# Patient Record
Sex: Female | Born: 1980
Health system: Southern US, Community
[De-identification: ages and names within clinical notes are randomized; demographics above are authoritative.]

## PROBLEM LIST (undated history)

## (undated) DIAGNOSIS — E119 Type 2 diabetes mellitus without complications: Secondary | ICD-10-CM

## (undated) DIAGNOSIS — I201 Angina pectoris with documented spasm: Secondary | ICD-10-CM

## (undated) HISTORY — PX: HYSTEROTOMY: SHX1776

## (undated) HISTORY — PX: OOPHORECTOMY: SHX86

## (undated) HISTORY — DX: Angina pectoris with documented spasm: I20.1

## (undated) HISTORY — DX: Type 2 diabetes mellitus without complications: E11.9

---

## 2013-03-12 DIAGNOSIS — I219 Acute myocardial infarction, unspecified: Secondary | ICD-10-CM

## 2013-03-12 HISTORY — DX: Acute myocardial infarction, unspecified: I21.9

## 2018-07-21 ENCOUNTER — Other Ambulatory Visit: Payer: Self-pay

## 2018-07-21 ENCOUNTER — Emergency Department (HOSPITAL_COMMUNITY)
Admission: EM | Admit: 2018-07-21 | Discharge: 2018-07-21 | Disposition: A | Discharge status: Home / Self Care | Payer: Commercial Managed Care - PPO | Attending: Emergency Medicine | Admitting: Emergency Medicine

## 2018-07-21 ENCOUNTER — Emergency Department (HOSPITAL_COMMUNITY): Payer: Commercial Managed Care - PPO

## 2018-07-21 ENCOUNTER — Encounter (HOSPITAL_COMMUNITY): Payer: Self-pay

## 2018-07-21 DIAGNOSIS — E119 Type 2 diabetes mellitus without complications: Secondary | ICD-10-CM | POA: Diagnosis not present

## 2018-07-21 DIAGNOSIS — R079 Chest pain, unspecified: Secondary | ICD-10-CM

## 2018-07-21 DIAGNOSIS — R0789 Other chest pain: Secondary | ICD-10-CM | POA: Diagnosis not present

## 2018-07-21 DIAGNOSIS — Z79899 Other long term (current) drug therapy: Secondary | ICD-10-CM | POA: Diagnosis not present

## 2018-07-21 LAB — CBC WITH DIFFERENTIAL/PLATELET
Abs Immature Granulocytes: 0.03 10*3/uL (ref 0.00–0.07)
Basophils Absolute: 0.1 10*3/uL (ref 0.0–0.1)
Basophils Relative: 1 %
Eosinophils Absolute: 0.2 10*3/uL (ref 0.0–0.5)
Eosinophils Relative: 3 %
HCT: 41.3 % (ref 36.0–46.0)
Hemoglobin: 13.9 g/dL (ref 12.0–15.0)
Immature Granulocytes: 0 %
Lymphocytes Relative: 38 %
Lymphs Abs: 2.9 10*3/uL (ref 0.7–4.0)
MCH: 28.8 pg (ref 26.0–34.0)
MCHC: 33.7 g/dL (ref 30.0–36.0)
MCV: 85.7 fL (ref 80.0–100.0)
Monocytes Absolute: 0.6 10*3/uL (ref 0.1–1.0)
Monocytes Relative: 8 %
Neutro Abs: 3.9 10*3/uL (ref 1.7–7.7)
Neutrophils Relative %: 50 %
Platelets: 277 10*3/uL (ref 150–400)
RBC: 4.82 MIL/uL (ref 3.87–5.11)
RDW: 12.1 % (ref 11.5–15.5)
WBC: 7.7 10*3/uL (ref 4.0–10.5)
nRBC: 0 % (ref 0.0–0.2)

## 2018-07-21 LAB — COMPREHENSIVE METABOLIC PANEL
ALT: 30 U/L (ref 0–44)
AST: 18 U/L (ref 15–41)
Albumin: 3.9 g/dL (ref 3.5–5.0)
Alkaline Phosphatase: 112 U/L (ref 38–126)
Anion gap: 14 (ref 5–15)
BUN: 9 mg/dL (ref 6–20)
CO2: 25 mmol/L (ref 22–32)
Calcium: 9.2 mg/dL (ref 8.9–10.3)
Chloride: 93 mmol/L — ABNORMAL LOW (ref 98–111)
Creatinine, Ser: 0.64 mg/dL (ref 0.44–1.00)
GFR calc Af Amer: >60 mL/min (ref >60)
GFR calc non Af Amer: >60 mL/min (ref >60)
Glucose, Bld: 388 mg/dL — ABNORMAL HIGH (ref 70–99)
Potassium: 3.9 mmol/L (ref 3.5–5.1)
Sodium: 132 mmol/L — ABNORMAL LOW (ref 135–145)
Total Bilirubin: 0.5 mg/dL (ref 0.3–1.2)
Total Protein: 7.5 g/dL (ref 6.5–8.1)

## 2018-07-21 LAB — TROPONIN I
Troponin I: <0.03 ng/mL (ref <0.03)
Troponin I: <0.03 ng/mL (ref <0.03)

## 2018-07-21 LAB — CBG MONITORING, ED: Glucose-Capillary: 324 mg/dL — ABNORMAL HIGH (ref 70–99)

## 2018-07-21 NOTE — Discharge Instructions (Addendum)
Please follow up with your doctor for recheck this week. Return to the emergency department if you have any new or worsening symptoms.   Results for orders placed or performed during the hospital encounter of 07/21/18  Troponin I - Once  Result Value Ref Range   Troponin I <0.03 <0.03 ng/mL  Comprehensive metabolic panel  Result Value Ref Range   Sodium 132 (L) 135 - 145 mmol/L   Potassium 3.9 3.5 - 5.1 mmol/L   Chloride 93 (L) 98 - 111 mmol/L   CO2 25 22 - 32 mmol/L   Glucose, Bld 388 (H) 70 - 99 mg/dL   BUN 9 6 - 20 mg/dL   Creatinine, Ser 8.78 0.44 - 1.00 mg/dL   Calcium 9.2 8.9 - 67.6 mg/dL   Total Protein 7.5 6.5 - 8.1 g/dL   Albumin 3.9 3.5 - 5.0 g/dL   AST 18 15 - 41 U/L   ALT 30 0 - 44 U/L   Alkaline Phosphatase 112 38 - 126 U/L   Total Bilirubin 0.5 0.3 - 1.2 mg/dL   GFR calc non Af Amer >60 >60 mL/min   GFR calc Af Amer >60 >60 mL/min   Anion gap 14 5 - 15  CBC with Differential  Result Value Ref Range   WBC 7.7 4.0 - 10.5 K/uL   RBC 4.82 3.87 - 5.11 MIL/uL   Hemoglobin 13.9 12.0 - 15.0 g/dL   HCT 72.0 94.7 - 09.6 %   MCV 85.7 80.0 - 100.0 fL   MCH 28.8 26.0 - 34.0 pg   MCHC 33.7 30.0 - 36.0 g/dL   RDW 28.3 66.2 - 94.7 %   Platelets 277 150 - 400 K/uL   nRBC 0.0 0.0 - 0.2 %   Neutrophils Relative % 50 %   Neutro Abs 3.9 1.7 - 7.7 K/uL   Lymphocytes Relative 38 %   Lymphs Abs 2.9 0.7 - 4.0 K/uL   Monocytes Relative 8 %   Monocytes Absolute 0.6 0.1 - 1.0 K/uL   Eosinophils Relative 3 %   Eosinophils Absolute 0.2 0.0 - 0.5 K/uL   Basophils Relative 1 %   Basophils Absolute 0.1 0.0 - 0.1 K/uL   Immature Granulocytes 0 %   Abs Immature Granulocytes 0.03 0.00 - 0.07 K/uL  Troponin I - Once  Result Value Ref Range   Troponin I <0.03 <0.03 ng/mL  CBG monitoring, ED  Result Value Ref Range   Glucose-Capillary 324 (H) 70 - 99 mg/dL   Dg Chest Portable 1 View  Result Date: 07/21/2018 CLINICAL DATA:  38 year old female with chest pain. EXAM: PORTABLE CHEST 1  VIEW COMPARISON:  None. FINDINGS: The heart size and mediastinal contours are within normal limits. Both lungs are clear. The visualized skeletal structures are unremarkable. IMPRESSION: No active disease. Electronically Signed   By: Elgie Collard M.D.   On: 07/21/2018 01:33

## 2018-07-21 NOTE — ED Provider Notes (Signed)
[facility] EMERGENCY DEPARTMENT Provider Note   CSN: [id number] Arrival date & time: 07/21/18  0028    History   Chief Complaint Chief Complaint  Patient presents with  . Chest Pain    HPI Debra Mullins is a 38 y.o. female.     Patient with history of DM, Prinzmetal Angina, depression presents with chest pain that started around 10:30 tonight. She denies fever, cough, congestion. No nausea or diaphoresis. She reports feeling a little lightheaded. She works at [employer] and started having to wear a mask at work which she feels is making her work to breathe and that this is the cause of her chest pain tonight. She took 3 aspirin, an antacid prior to coming to the hospital without any relief.   The history is provided by the patient. No language interpreter was used.  Chest Pain  Associated symptoms: shortness of breath   Associated symptoms: no abdominal pain, no cough, no fever and no nausea     History reviewed. No pertinent past medical history.  There are no active problems to display for this patient.    OB History   No obstetric history on file.      Home Medications    Prior to Admission medications   Medication Sig Start Date End Date Taking? Authorizing Provider  diltiazem (CARDIZEM) 30 MG tablet Take 30 mg by mouth 2 (two) times daily.   Yes [provider]  escitalopram (LEXAPRO) 10 MG tablet Take 10 mg by mouth daily.   Yes [provider]    Family History History reviewed. No pertinent family history.  Social History Social History   Tobacco Use  . Smoking status: Not on file  Substance Use Topics  . Alcohol use: Not on file  . Drug use: Not on file     Allergies   Sulfa antibiotics and Zithromax [azithromycin]   Review of Systems Review of Systems  Constitutional: Negative for chills and fever.  HENT: Negative.  Negative for congestion and sore throat.   Respiratory: Positive for shortness  of breath. Negative for cough.   Cardiovascular: Positive for chest pain.  Gastrointestinal: Negative.  Negative for abdominal pain and nausea.  Musculoskeletal: Negative.   Skin: Negative.   Neurological: Positive for light-headedness. Negative for syncope.     Physical Exam Updated Vital Signs BP (!) 146/79   Temp 98.1 F (36.7 C) (Oral)   Resp 16   Ht 5\' 11"  (1.803 m)   Wt 136.1 kg   SpO2 100%   BMI 41.84 kg/m   Physical Exam Vitals signs and nursing note reviewed.  Constitutional:      Appearance: She is well-developed.  HENT:     Head: Normocephalic.  Neck:     Musculoskeletal: Normal range of motion and neck supple.  Cardiovascular:     Rate and Rhythm: Normal rate and regular rhythm.  Pulmonary:     Effort: Pulmonary effort is normal.     Breath sounds: Normal breath sounds. No wheezing, rhonchi or rales.     Comments: No chest wall tenderness.  Abdominal:     General: Bowel sounds are normal.     Palpations: Abdomen is soft.     Tenderness: There is no abdominal tenderness. There is no guarding or rebound.  Musculoskeletal: Normal range of motion.     Right lower leg: No edema.     Left lower leg: No edema.  Skin:    General: Skin is warm  and dry.     Findings: No rash.  Neurological:     General: No focal deficit present.     Mental Status: She is alert and oriented to person, place, and time.      ED Treatments / Results  Labs (all labs ordered are listed, but only abnormal results are displayed) Labs Reviewed  TROPONIN I  COMPREHENSIVE METABOLIC PANEL  CBC WITH DIFFERENTIAL/PLATELET    EKG EKG Interpretation  Date/Time:  Monday Jul 21 2018 00:44:12 EDT Ventricular Rate:  85 PR Interval:    QRS Duration: 89 QT Interval:  374 QTC Calculation: 445 R Axis:   22 Text Interpretation:  Sinus rhythm Low voltage, precordial leads No previous ECGs available Confirmed by Zadie Rhine ([id number]) on 07/21/2018 1:02:43 AM   Radiology No results  found.  Procedures Procedures (including critical care time)  Medications Ordered in ED Medications - No data to display   Initial Impression / Assessment and Plan / ED Course  I have reviewed the triage vital signs and the nursing notes.  Pertinent labs & imaging results that were available during my care of the patient were reviewed by me and considered in my medical decision making (see chart for details).        Patient with a history of Prinzmetal's Angina (5 years ago, "clean arteries, no stents") presents with CP starting tonight. She feels the mask she is now required to wear at work is causing her to have to breathe harder and this may be the source of her pain. Pain is getting better over time. No change with aspirin or antacid at home. No fever, congestion, cough.   Labs, including 2 troponins, are unremarkable with the exception of CBG elevated to over 300. She reports she did not take any insulin today but she will when she gets home. No acidosis, nausea, vomiting.   She can be discharged home with recommendation to follow up with her doctor for recheck this week.   Final Clinical Impressions(s) / ED Diagnoses   Final diagnoses:  None   1. Nonspecific chest pain  ED Discharge Orders    None       Elpidio Anis, PA-C 07/21/18 0536    Zadie Rhine, MD 07/21/18 [phone]

## 2018-07-21 NOTE — ED Provider Notes (Signed)
Patient seen/examined in the Emergency Department in conjunction with Advanced Practice Provider [place] Patient reports chest pain with h/o printzmetal angina.  Reports "clean" cath 5 yrs ago in [place] Exam : awake/alert, no distress, denies CP at this time Plan: workup in process.  Anticipate discharge home     Zadie Rhine, MD 07/21/18 [phone]

## 2019-02-02 DIAGNOSIS — R1032 Left lower quadrant pain: Secondary | ICD-10-CM | POA: Diagnosis not present

## 2019-02-02 DIAGNOSIS — Z6839 Body mass index (BMI) 39.0-39.9, adult: Secondary | ICD-10-CM | POA: Diagnosis not present

## 2019-02-02 DIAGNOSIS — R1031 Right lower quadrant pain: Secondary | ICD-10-CM | POA: Diagnosis not present

## 2019-02-10 DIAGNOSIS — E1165 Type 2 diabetes mellitus with hyperglycemia: Secondary | ICD-10-CM | POA: Diagnosis not present

## 2019-02-10 DIAGNOSIS — Z6841 Body Mass Index (BMI) 40.0 and over, adult: Secondary | ICD-10-CM | POA: Diagnosis not present

## 2019-02-10 DIAGNOSIS — Z299 Encounter for prophylactic measures, unspecified: Secondary | ICD-10-CM | POA: Diagnosis not present

## 2019-02-10 DIAGNOSIS — Z789 Other specified health status: Secondary | ICD-10-CM | POA: Diagnosis not present

## 2019-02-10 DIAGNOSIS — N809 Endometriosis, unspecified: Secondary | ICD-10-CM | POA: Diagnosis not present

## 2019-02-10 DIAGNOSIS — R109 Unspecified abdominal pain: Secondary | ICD-10-CM | POA: Diagnosis not present

## 2019-03-16 DIAGNOSIS — N809 Endometriosis, unspecified: Secondary | ICD-10-CM | POA: Diagnosis not present

## 2019-03-16 DIAGNOSIS — Z6841 Body Mass Index (BMI) 40.0 and over, adult: Secondary | ICD-10-CM | POA: Diagnosis not present

## 2019-03-16 DIAGNOSIS — S39012A Strain of muscle, fascia and tendon of lower back, initial encounter: Secondary | ICD-10-CM | POA: Diagnosis not present

## 2019-04-06 DIAGNOSIS — B9689 Other specified bacterial agents as the cause of diseases classified elsewhere: Secondary | ICD-10-CM | POA: Diagnosis not present

## 2019-04-06 DIAGNOSIS — R05 Cough: Secondary | ICD-10-CM | POA: Diagnosis not present

## 2019-04-06 DIAGNOSIS — J209 Acute bronchitis, unspecified: Secondary | ICD-10-CM | POA: Diagnosis not present

## 2019-04-06 DIAGNOSIS — Z881 Allergy status to other antibiotic agents status: Secondary | ICD-10-CM | POA: Diagnosis not present

## 2019-04-06 DIAGNOSIS — R0602 Shortness of breath: Secondary | ICD-10-CM | POA: Diagnosis not present

## 2019-04-06 DIAGNOSIS — Z882 Allergy status to sulfonamides status: Secondary | ICD-10-CM | POA: Diagnosis not present

## 2019-04-06 DIAGNOSIS — R079 Chest pain, unspecified: Secondary | ICD-10-CM | POA: Diagnosis not present

## 2019-04-06 DIAGNOSIS — Z888 Allergy status to other drugs, medicaments and biological substances status: Secondary | ICD-10-CM | POA: Diagnosis not present

## 2019-06-16 DIAGNOSIS — Z794 Long term (current) use of insulin: Secondary | ICD-10-CM | POA: Diagnosis not present

## 2019-06-16 DIAGNOSIS — Z87892 Personal history of anaphylaxis: Secondary | ICD-10-CM | POA: Diagnosis not present

## 2019-06-16 DIAGNOSIS — M791 Myalgia, unspecified site: Secondary | ICD-10-CM | POA: Diagnosis not present

## 2019-06-16 DIAGNOSIS — Z883 Allergy status to other anti-infective agents status: Secondary | ICD-10-CM | POA: Diagnosis not present

## 2019-06-16 DIAGNOSIS — B349 Viral infection, unspecified: Secondary | ICD-10-CM | POA: Diagnosis not present

## 2019-06-16 DIAGNOSIS — R110 Nausea: Secondary | ICD-10-CM | POA: Diagnosis not present

## 2019-06-16 DIAGNOSIS — Z79899 Other long term (current) drug therapy: Secondary | ICD-10-CM | POA: Diagnosis not present

## 2019-06-16 DIAGNOSIS — Z888 Allergy status to other drugs, medicaments and biological substances status: Secondary | ICD-10-CM | POA: Diagnosis not present

## 2019-06-16 DIAGNOSIS — Z20822 Contact with and (suspected) exposure to covid-19: Secondary | ICD-10-CM | POA: Diagnosis not present

## 2019-06-16 DIAGNOSIS — Z882 Allergy status to sulfonamides status: Secondary | ICD-10-CM | POA: Diagnosis not present

## 2019-06-16 DIAGNOSIS — R0789 Other chest pain: Secondary | ICD-10-CM | POA: Diagnosis not present

## 2019-06-16 DIAGNOSIS — R5383 Other fatigue: Secondary | ICD-10-CM | POA: Diagnosis not present

## 2019-06-16 DIAGNOSIS — E1165 Type 2 diabetes mellitus with hyperglycemia: Secondary | ICD-10-CM | POA: Diagnosis not present

## 2019-06-16 DIAGNOSIS — R079 Chest pain, unspecified: Secondary | ICD-10-CM | POA: Diagnosis not present

## 2019-06-17 ENCOUNTER — Ambulatory Visit (INDEPENDENT_AMBULATORY_CARE_PROVIDER_SITE_OTHER): Payer: BC Managed Care – PPO | Admitting: Cardiovascular Disease

## 2019-06-17 ENCOUNTER — Telehealth: Payer: Self-pay | Admitting: Cardiovascular Disease

## 2019-06-17 ENCOUNTER — Encounter: Payer: Self-pay | Admitting: *Deleted

## 2019-06-17 ENCOUNTER — Other Ambulatory Visit: Payer: Self-pay

## 2019-06-17 VITALS — BP 130/94 | HR 85 | Ht 71.0 in | Wt 296.0 lb

## 2019-06-17 DIAGNOSIS — R030 Elevated blood-pressure reading, without diagnosis of hypertension: Secondary | ICD-10-CM | POA: Diagnosis not present

## 2019-06-17 DIAGNOSIS — R079 Chest pain, unspecified: Secondary | ICD-10-CM

## 2019-06-17 DIAGNOSIS — Z8679 Personal history of other diseases of the circulatory system: Secondary | ICD-10-CM | POA: Diagnosis not present

## 2019-06-17 NOTE — Telephone Encounter (Signed)
  Precert needed for: Echo and Lexiscan  Location:   [facility] Date:  July 02, 2019

## 2019-06-17 NOTE — Progress Notes (Signed)
CARDIOLOGY CONSULT NOTE  Patient ID: Kylia Grajales MRN: [id number] DOB/AGE: [birth year] 39 y.o.  Admit date: (Not on file) Primary Physician: Patient, No Pcp Per  Reason for Consultation: Chest pain  HPI: Debra Mullins is a 39 y.o. female who is being seen today for the evaluation of chest pain.  She is self-referred.  Past medical history includes morbid obesity.  She was evaluated yesterday at [facility] for chest pain.  She complained of 2 days of malaise which got worse with some body aches and some nausea and feelings of coldness.  I reviewed extensive documentation records in the EMR.  Labs included white blood cells 10.6, hemoglobin 14.3, platelets 262, sodium 134, potassium 4.2, BUN 12, creatinine 0.53, negative for COVID-19, elevated total CK of 420, elevated CK-MB of 10.9.  Troponins were normal.  Chest x-ray showed no active disease.  It was felt she had a viral syndrome.  An ECG was performed but I do not have the results.  It reportedly demonstrated sinus rhythm.  She continues to complain of diffuse body aches and generalized fatigue.  She has episodic right-sided chest pains at rest.  She reportedly had an MI 6 years ago and was diagnosed with Prinzmetal's angina.  She underwent cardiac catheterization and there were no blockages.  This was performed at [facility] [place], [place]).  She followed with a cardiologist there, Dr. Jacqulyn Liner, who works in [place].  She has a lot of work-related stress.  She has received the first dose of the COVID-19 vaccine.  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  She drinks 5-6 bottles of water daily.  She is here with her husband.  Social history: They have a daughter who is graduating college in [place] within the next few months and will be pursuing her masters in Chief Technology Officer at [employer].        Allergies  Allergen Reactions  . Sulfa Antibiotics Anaphylaxis  . Zithromax  [Azithromycin] Diarrhea  . Prednisone Palpitations    Current Outpatient Medications  Medication Sig Dispense Refill  . acetaminophen (TYLENOL) 500 MG tablet Take 500 mg by mouth every 6 (six) hours as needed for mild pain or headache.     No current facility-administered medications for this visit.    Past Medical History:  Diagnosis Date  . Diabetes ([place])   . Heart attack ([place]) 2015    Past Surgical History:  Procedure Laterality Date  . HYSTEROTOMY    . OOPHORECTOMY      Social History   Socioeconomic History  . Marital status: Married    Spouse name: Not on file  . Number of children: Not on file  . Years of education: Not on file  . Highest education level: Not on file  Occupational History  . Not on file  Tobacco Use  . Smoking status: Never Smoker  . Smokeless tobacco: Never Used  Substance and Sexual Activity  . Alcohol use: Not on file  . Drug use: Not on file  . Sexual activity: Not on file  Other Topics Concern  . Not on file  Social History Narrative  . Not on file   Social Determinants of Health   Financial Resource Strain:   . Difficulty of Paying Living Expenses:   Food Insecurity:   . Worried About Charity fundraiser in the Last Year:   . Arboriculturist in the Last Year:   Transportation Needs:   .  Lack of Transportation (Medical):   Marland Kitchen Lack of Transportation (Non-Medical):   Physical Activity:   . Days of Exercise per Week:   . Minutes of Exercise per Session:   Stress:   . Feeling of Stress :   Social Connections:   . Frequency of Communication with Friends and Family:   . Frequency of Social Gatherings with Friends and Family:   . Attends Religious Services:   . Active Member of Clubs or Organizations:   . Attends Banker Meetings:   Marland Kitchen Marital Status:   Intimate Partner Violence:   . Fear of Current or Ex-Partner:   . Emotionally Abused:   Marland Kitchen Physically Abused:   . Sexually Abused:      No family history of  premature CAD in 1st degree relatives.  Current Meds  Medication Sig  . acetaminophen (TYLENOL) 500 MG tablet Take 500 mg by mouth every 6 (six) hours as needed for mild pain or headache.  . [DISCONTINUED] diltiazem (CARDIZEM) 30 MG tablet Take 30 mg by mouth 2 (two) times daily.      Review of systems complete and found to be negative unless listed above in HPI   Marlyn Corporal, RN was present throughout the entirety of the encounter.  Physical exam Blood pressure (!) 130/94, pulse 85, height 5\' 11"  (1.803 m), weight 296 lb (134.3 kg), SpO2 98 %. General: Obese female in NAD Neck: No JVD, no thyromegaly or thyroid nodule.  Lungs: Clear to auscultation bilaterally with normal respiratory effort. CV: Nondisplaced PMI. Regular rate and rhythm, normal S1/S2, no S3/S4, no murmur.  No peripheral edema.  No carotid bruit.    Abdomen: Soft, nontender, obese.  Skin: Intact without lesions or rashes.  Neurologic: Alert and oriented x 3.  Psych: Normal affect. Extremities: No clubbing or cyanosis.  HEENT: Normal.   ECG: Most recent ECG reviewed.   Labs: Lab Results  Component Value Date/Time   K 3.9 07/21/2018 12:54 AM   BUN 9 07/21/2018 12:54 AM   CREATININE 0.64 07/21/2018 12:54 AM   ALT 30 07/21/2018 12:54 AM   HGB 13.9 07/21/2018 12:54 AM     Lipids: No results found for: LDLCALC, LDLDIRECT, CHOL, TRIG, HDL      ASSESSMENT AND PLAN:   1.  Chest pain: She reportedly has a history of Prinzmetal's angina.  I will request records from 09/20/2018 including cardiac catheterization report.  I will proceed with a nuclear myocardial perfusion imaging study to evaluate for ischemic heart disease (Lexiscan Myoview). I will order a 2-D echocardiogram with Doppler to evaluate cardiac structure, function, and regional wall motion.  2.  Elevated blood pressure: She does not carry diagnosis of hypertension.  Diastolic blood pressure is mildly elevated in our office and was elevated in the  ED.  This will need further monitoring.    Disposition: Follow up in 2 months virtual visit  Signed: [place], M.D., F.A.C.C.  06/17/2019, 4:06 PM

## 2019-06-17 NOTE — Patient Instructions (Signed)
Medication Instructions:  Your physician recommends that you continue on your current medications as directed. Please refer to the Current Medication list given to you today.  *If you need a refill on your cardiac medications before your next appointment, please call your pharmacy*   Lab Work: None today If you have labs (blood work) drawn today and your tests are completely normal, you will receive your results only by: Marland Kitchen MyChart Message (if you have MyChart) OR . A paper copy in the mail If you have any lab test that is abnormal or we need to change your treatment, we will call you to review the results.   Testing/Procedures: Your physician has requested that you have an echocardiogram. Echocardiography is a painless test that uses sound waves to create images of your heart. It provides your doctor with information about the size and shape of your heart and how well your heart's chambers and valves are working. This procedure takes approximately one hour. There are no restrictions for this procedure.  Your physician has requested that you have a lexiscan myoview. For further information please visit [email or web address] Please follow instruction sheet, as given.     Follow-Up: At [facility], you and your health needs are our priority.  As part of our continuing mission to provide you with exceptional heart care, we have created designated Provider Care Teams.  These Care Teams include your primary Cardiologist (physician) and Advanced Practice Providers (APPs -  Physician Assistants and Nurse Practitioners) who all work together to provide you with the care you need, when you need it.  We recommend signing up for the patient portal called "MyChart".  Sign up information is provided on this After Visit Summary.  MyChart is used to connect with patients for Virtual Visits (Telemedicine).  Patients are able to view lab/test results, encounter notes, upcoming appointments, etc.  Non-urgent  messages can be sent to your provider as well.   To learn more about what you can do with MyChart, go to [email or web address].    Your next appointment:   2 month(s)  The format for your next appointment:   Virtual Visit   Provider:   Prentice Docker, MD   Other Instructions None     Thank you for choosing [facility] !

## 2019-06-25 DIAGNOSIS — T50Z95A Adverse effect of other vaccines and biological substances, initial encounter: Secondary | ICD-10-CM | POA: Diagnosis not present

## 2019-06-25 DIAGNOSIS — R000 Tachycardia, unspecified: Secondary | ICD-10-CM | POA: Diagnosis not present

## 2019-06-25 DIAGNOSIS — Z881 Allergy status to other antibiotic agents status: Secondary | ICD-10-CM | POA: Diagnosis not present

## 2019-06-25 DIAGNOSIS — Z888 Allergy status to other drugs, medicaments and biological substances status: Secondary | ICD-10-CM | POA: Diagnosis not present

## 2019-06-25 DIAGNOSIS — R509 Fever, unspecified: Secondary | ICD-10-CM | POA: Diagnosis not present

## 2019-06-25 DIAGNOSIS — Z90710 Acquired absence of both cervix and uterus: Secondary | ICD-10-CM | POA: Diagnosis not present

## 2019-06-25 DIAGNOSIS — Z794 Long term (current) use of insulin: Secondary | ICD-10-CM | POA: Diagnosis not present

## 2019-06-25 DIAGNOSIS — Z882 Allergy status to sulfonamides status: Secondary | ICD-10-CM | POA: Diagnosis not present

## 2019-06-25 DIAGNOSIS — R079 Chest pain, unspecified: Secondary | ICD-10-CM | POA: Diagnosis not present

## 2019-06-25 DIAGNOSIS — T50B95A Adverse effect of other viral vaccines, initial encounter: Secondary | ICD-10-CM | POA: Diagnosis not present

## 2019-07-02 ENCOUNTER — Other Ambulatory Visit: Payer: Self-pay

## 2019-07-02 ENCOUNTER — Ambulatory Visit (HOSPITAL_BASED_OUTPATIENT_CLINIC_OR_DEPARTMENT_OTHER)
Admission: RE | Admit: 2019-07-02 | Discharge: 2019-07-02 | Disposition: A | Discharge status: Home / Self Care | Payer: BC Managed Care – PPO | Source: Ambulatory Visit | Attending: Cardiovascular Disease | Admitting: Cardiovascular Disease

## 2019-07-02 ENCOUNTER — Encounter (HOSPITAL_COMMUNITY)
Admission: RE | Admit: 2019-07-02 | Discharge: 2019-07-02 | Disposition: A | Discharge status: Home / Self Care | Payer: BC Managed Care – PPO | Source: Ambulatory Visit | Attending: Cardiovascular Disease | Admitting: Cardiovascular Disease

## 2019-07-02 ENCOUNTER — Encounter (HOSPITAL_COMMUNITY): Payer: Self-pay

## 2019-07-02 ENCOUNTER — Ambulatory Visit (HOSPITAL_COMMUNITY)
Admission: RE | Admit: 2019-07-02 | Discharge: 2019-07-02 | Disposition: A | Discharge status: Home / Self Care | Payer: BC Managed Care – PPO | Source: Ambulatory Visit | Attending: Cardiovascular Disease | Admitting: Cardiovascular Disease

## 2019-07-02 DIAGNOSIS — R079 Chest pain, unspecified: Secondary | ICD-10-CM

## 2019-07-02 LAB — NM MYOCAR MULTI W/SPECT W/WALL MOTION / EF
LV dias vol: 92 mL (ref 46–106)
LV sys vol: 42 mL
Peak HR: 118 {beats}/min
RATE: 0.55
Rest HR: 75 {beats}/min
SDS: 1
SRS: 0
SSS: 1
TID: 1.06

## 2019-07-02 MED ORDER — SODIUM CHLORIDE FLUSH 0.9 % IV SOLN
INTRAVENOUS | Status: AC
  Administered 2019-07-02: 11:00:00 10 mL via INTRAVENOUS
  Filled 2019-07-02: qty 10

## 2019-07-02 MED ORDER — REGADENOSON 0.4 MG/5ML IV SOLN
INTRAVENOUS | Status: AC
  Administered 2019-07-02: 0.4 mg via INTRAVENOUS
  Filled 2019-07-02: qty 5

## 2019-07-02 MED ORDER — TECHNETIUM TC 99M TETROFOSMIN IV KIT
30.0000 | PACK | Freq: Once | INTRAVENOUS | Status: AC | PRN
  Administered 2019-07-02: 31.1 via INTRAVENOUS

## 2019-07-02 MED ORDER — TECHNETIUM TC 99M TETROFOSMIN IV KIT
10.0000 | PACK | Freq: Once | INTRAVENOUS | Status: AC | PRN
  Administered 2019-07-02: 9.88 via INTRAVENOUS

## 2019-07-02 NOTE — Progress Notes (Signed)
*  PRELIMINARY RESULTS* Echocardiogram 2D Echocardiogram has been performed.  Stacey Drain 07/02/2019, 9:24 AM

## 2019-07-08 ENCOUNTER — Telehealth: Payer: Self-pay | Admitting: *Deleted

## 2019-07-08 NOTE — Telephone Encounter (Signed)
STRESS TEST -  Laqueta Linden, MD  07/02/2019 3:00 PM EDT    No significant blockages    ECHO -    Laqueta Linden, MD  07/02/2019 10:44 AM EDT    Normal pumping function

## 2019-07-08 NOTE — Telephone Encounter (Signed)
Patient notified.  No pcp.

## 2019-09-08 ENCOUNTER — Telehealth: Payer: BC Managed Care – PPO | Admitting: Cardiovascular Disease

## 2020-02-19 IMAGING — DX PORTABLE CHEST - 1 VIEW
1 series · 1 of 1 positions shown · non-contrast
Comparison: None.

CLINICAL DATA: 38-year-old female with chest pain.

EXAM:
PORTABLE CHEST 1 VIEW

[chest]
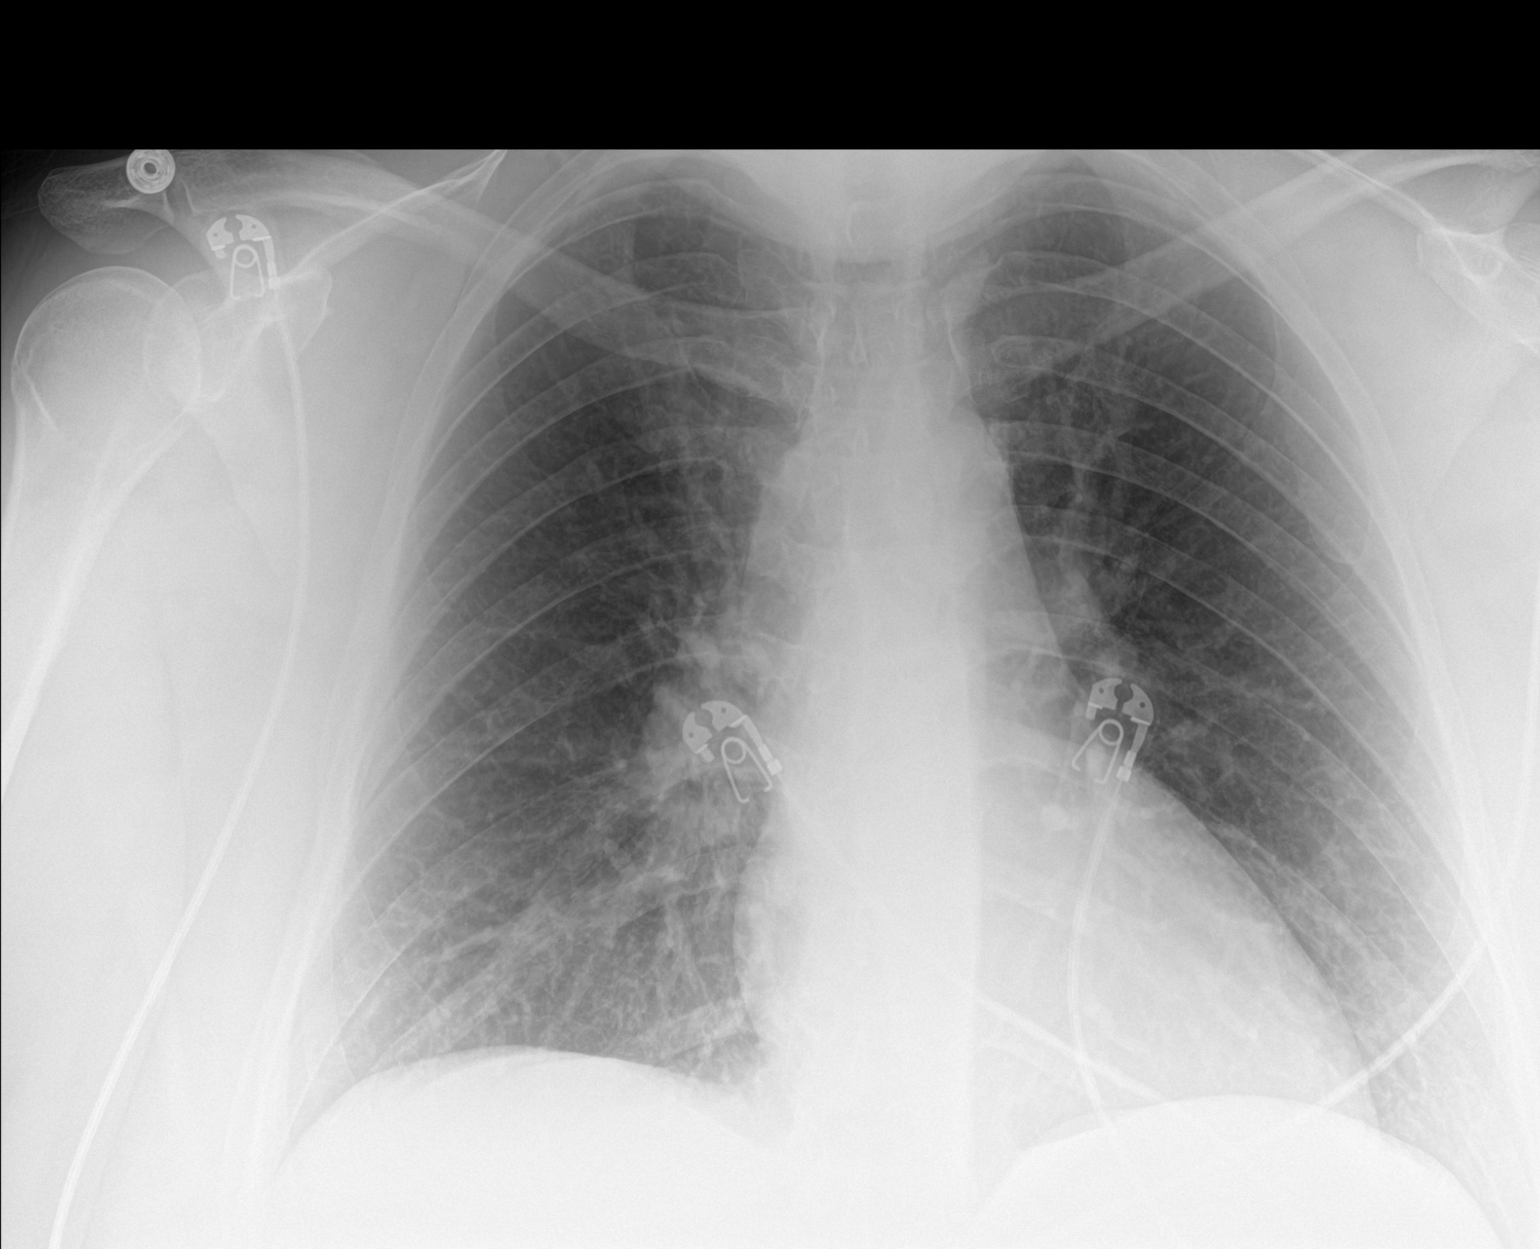

[1 of 1 positions shown; findings below may reference images not displayed]

FINDINGS: The heart size and mediastinal contours are within normal limits.
Both lungs are clear. The visualized skeletal structures are
unremarkable.
IMPRESSION: No active disease.

## 2020-03-03 DIAGNOSIS — Z20822 Contact with and (suspected) exposure to covid-19: Secondary | ICD-10-CM | POA: Diagnosis not present

## 2020-03-03 DIAGNOSIS — J069 Acute upper respiratory infection, unspecified: Secondary | ICD-10-CM | POA: Diagnosis not present

## 2020-04-08 ENCOUNTER — Telehealth: Payer: Self-pay | Admitting: Family Medicine

## 2020-04-08 DIAGNOSIS — R079 Chest pain, unspecified: Secondary | ICD-10-CM | POA: Diagnosis not present

## 2020-04-08 NOTE — Telephone Encounter (Signed)
Spoke with husband that walked into office - stated that wife works at [employer] (not clinical) & wanted him to come by office for her.  Stated that she has had this x 2 day with worsening symptoms - advised ED if having active symptoms.  Informed husband that I will call patient to get further information.  He verbalized understanding.

## 2020-04-08 NOTE — Telephone Encounter (Signed)
New message     Pt c/o of Chest Pain: STAT if CP now or developed within 24 hours  1. Are you having CP right now?  Chest pressure   2. Are you experiencing any other symptoms (ex. SOB, nausea, vomiting, sweating)? Fluttering, sob, pain in left shoulder and behind the breast bone , fatigue   3. How long have you been experiencing CP? Off and on for awhile , got worse yesterday   4. Is your CP continuous or coming and going?comes and goes   5. Have you taken Nitroglycerin?  No it expired  ?

## 2020-04-08 NOTE — Telephone Encounter (Signed)
Complain of tightness in chest, fluttering.  Does not feel like heart is beating any faster.    Been off/on x 3-4 months, but worse the last few days.  Does not feels like she needs to be seen in ED.  States that she does not feel like she did when she had her heart attack.  States that she does also have Prinzmetal's angina & tries to manage this at home.  Patient not on any cardiac medications.    OV scheduled for Tuesday, 2/1/20222 with Tereso Newcomer, PA at 8:45 am in [place] office.    Advised to be evaluated in ED if symptoms worsen in the meantime.  She verbalized understanding.

## 2020-04-08 NOTE — Telephone Encounter (Signed)
Left message to return call on patient's voice mail.

## 2020-04-11 NOTE — Progress Notes (Unsigned)
Cardiology Office Note:    Date:  04/12/2020   ID:  Debra Mullins, DOB November 23, 1980, MRN [id number]  PCP:  Patient, No Pcp Per  [place] Cardiologist:   Will need to establish with MD [place] Electrophysiologist:  None   Referring MD: No ref. provider found   Chief Complaint:  Chest Pain and Palpitations    Patient Profile:    Debra Mullins is a 40 y.o. female with:   Prinzmetal's Angina  Hx of NSTEMI in 2015 (according to notes; cath at [facility] in [place], [place] w/o blockages)  Diabetes mellitus   Hyperlipidemia   GERD   Obesity   Prior CV studies: Echocardiogram 07/02/19 EF 55-60, no RWMA, normal RVSF, trivial MR  Myoview 07/02/19 No ischemia or scar, EF 54; low risk   History of Present Illness:    Debra Mullins was evaluated by Dr. Bronson Ing in 4/21 for chest pain.  She has a hx of Prinzmetal's angina and notes indicate she had a cath around 2015 in the setting of a NSTEMI in [place] that showed no CAD.  Records were to be obtained, but no cath records can be located in her chart.  A f/u Myoview was low risk and an echocardiogram demonstrated normal EF.  She called in recently with chest pain and palpitations and is added on for evaluation.  Over the past several days, the patient notes fluttering in her chest as well as chest tightness.  This seems to be worse today and has been fairly constant for the past 24 hours.  She notes some shortness of breath associated with it.  She has not had syncope.  Her symptoms do remind her of what she had after her severe chest pain that she had with her heart attack 6 years ago.      Past Medical History:  Diagnosis Date  . Diabetes ([place])   . Heart attack ([place]) 2015    Current Medications: Current Meds  Medication Sig  . acetaminophen (TYLENOL) 500 MG tablet Take 500 mg by mouth every 6 (six) hours as needed for mild pain or headache.  Marland Kitchen aspirin EC 81 MG tablet Take 1 tablet (81 mg total) by mouth daily. Swallow whole.  .  diltiazem (CARDIZEM CD) 120 MG 24 hr capsule Take 1 capsule (120 mg total) by mouth daily.  . Insulin Degludec (TRESIBA [place]) Inject into the skin.  Marland Kitchen nitroGLYCERIN (NITROSTAT) 0.4 MG SL tablet Place 1 tablet (0.4 mg total) under the tongue every 5 (five) minutes as needed for chest pain.  . rosuvastatin (CRESTOR) 10 MG tablet Take 1 tablet (10 mg total) by mouth daily.     Allergies:   Sulfa antibiotics, Zithromax [azithromycin], and Prednisone   Social History   Tobacco Use  . Smoking status: Never Smoker  . Smokeless tobacco: Never Used     Family Hx: The patient's family history includes CAD in her mother; Heart attack in her paternal aunt.  Review of Systems  Constitutional: Negative for chills and fever.  Respiratory: Negative for cough.   Gastrointestinal: Negative for hematochezia and melena.  Genitourinary: Negative for hematuria.  All other systems reviewed and are negative.    EKGs/Labs/Other Test Reviewed:    EKG:  EKG is  ordered today.  The ekg ordered today demonstrates normal sinus rhythm, heart rate 89, normal axis, low voltage, no acute ST-T wave changes, QTC 445  Recent Labs: 04/12/2020: ALT 27; BUN 12; Creatinine, Ser 0.63; Hemoglobin 14.6; Platelets 290; Potassium 4.4;  Sodium 131   Recent Lipid Panel No results found for: CHOL, TRIG, HDL, CHOLHDL, LDLCALC, LDLDIRECT    Risk Assessment/Calculations:      Physical Exam:    VS:  BP (!) 150/94   Pulse 87   Ht 5' 11"  (1.803 m)   Wt 291 lb 6.4 oz (132.2 kg)   SpO2 96%   BMI 40.64 kg/m     Wt Readings from Last 3 Encounters:  04/12/20 291 lb 6.4 oz (132.2 kg)  06/17/19 296 lb (134.3 kg)  07/21/18 300 lb (136.1 kg)     Constitutional:      Appearance: Healthy appearance. Not in distress.  Neck:     Vascular: No JVR. JVD normal.  Pulmonary:     Effort: Pulmonary effort is normal.     Breath sounds: No wheezing. No rales.  Cardiovascular:     Normal rate. Regular rhythm. Normal S1. Normal S2.      Murmurs: There is no murmur.  Pulses:    Intact distal pulses.  Edema:    Peripheral edema absent.  Abdominal:     Palpations: Abdomen is soft. There is no hepatomegaly.  Skin:    General: Skin is warm and dry.  Neurological:     Mental Status: Alert and oriented to person, place and time.     Cranial Nerves: Cranial nerves are intact.      ASSESSMENT & PLAN:    1. Angina pectoris ([place]) She has a history of Prinzmetal's angina.  She has a history of prior non-STEMI in [place] 6 years ago.  Her cardiac catheterization demonstrated normal coronary arteries, per her report.  I do not have any records.  Those will be requested.  Over the past several days, she has had symptoms of palpitations and chest tightness.  This has been with minimal activity.  She is also had at rest.  She has had rest symptoms for the past 24 hours.  Her electrocardiogram does not demonstrate any acute changes.  She had an EKG at the urgent care the other day that appears to have some limb lead reversal but otherwise, there were no acute changes.  She likely is having exacerbation of her Prinzmetal's angina.  She does have diabetes which puts her at risk for obstructive coronary disease.  I reviewed her case with Dr. Harl Bowie (attending MD).  As her EKG is normal, we feel that she can be managed as an outpatient.  I will obtain a c-Met, CBC and stat high-sensitivity troponin today.  If her troponin is abnormal, she will be sent to the emergency room for inpatient evaluation.  I will start her on diltiazem CD 120 mg daily, aspirin 81 mg daily, as needed nitroglycerin and rosuvastatin 10 mg daily.  I will also obtain a limited echo to reassess EF and rule out wall motion abnormalities.  She will be seen in the next several days for follow-up.  She knows to go the emergency room if her symptoms should worsen.  If she continues to have symptoms at follow-up despite antianginal therapy, we will need to consider coronary CTA versus  relook cardiac catheterization.  2. Palpitations As noted, I will place her on diltiazem.  If she continues to have symptoms of palpitations, may need to consider event monitor.  3. Type 2 diabetes mellitus without complication, with long-term current use of insulin ([place]) She is on insulin therapy.  She notes poor control of diabetes.  She needs to establish with primary care.  4.  Pure hypercholesterolemia She notes myalgias with simvastatin in the past.  We will try rosuvastatin as noted above.  At follow-up, arrange fasting lipids and LFTs in 3 months        Dispo:  Return in about 1 week (around 04/19/2020).   Medication Adjustments/Labs and Tests Ordered: Current medicines are reviewed at length with the patient today.  Concerns regarding medicines are outlined above.  Tests Ordered: Orders Placed This Encounter  Procedures  . Comprehensive metabolic panel  . CBC  . Troponin T, STAT ([facility])  . EKG 12-Lead  . ECHOCARDIOGRAM LIMITED   Medication Changes: Meds ordered this encounter  Medications  . diltiazem (CARDIZEM CD) 120 MG 24 hr capsule    Sig: Take 1 capsule (120 mg total) by mouth daily.    Dispense:  90 capsule    Refill:  3  . rosuvastatin (CRESTOR) 10 MG tablet    Sig: Take 1 tablet (10 mg total) by mouth daily.    Dispense:  90 tablet    Refill:  3  . nitroGLYCERIN (NITROSTAT) 0.4 MG SL tablet    Sig: Place 1 tablet (0.4 mg total) under the tongue every 5 (five) minutes as needed for chest pain.    Dispense:  25 tablet    Refill:  11  . aspirin EC 81 MG tablet    Sig: Take 1 tablet (81 mg total) by mouth daily. Swallow whole.    Dispense:  30 tablet    Refill:  [address] [place], [place]  04/12/2020 [facility] [place], [place], [place]  [address] Phone: [phone]; Fax: [phone]

## 2020-04-12 ENCOUNTER — Other Ambulatory Visit: Payer: Self-pay

## 2020-04-12 ENCOUNTER — Other Ambulatory Visit (HOSPITAL_COMMUNITY)
Admission: RE | Admit: 2020-04-12 | Discharge: 2020-04-12 | Disposition: A | Discharge status: Home / Self Care | Payer: BC Managed Care – PPO | Source: Ambulatory Visit | Attending: Physician Assistant | Admitting: Physician Assistant

## 2020-04-12 ENCOUNTER — Ambulatory Visit (INDEPENDENT_AMBULATORY_CARE_PROVIDER_SITE_OTHER): Payer: BC Managed Care – PPO | Admitting: Physician Assistant

## 2020-04-12 ENCOUNTER — Encounter: Payer: Self-pay | Admitting: Physician Assistant

## 2020-04-12 ENCOUNTER — Telehealth: Payer: Self-pay | Admitting: Physician Assistant

## 2020-04-12 VITALS — BP 150/94 | HR 87 | Ht 71.0 in | Wt 291.4 lb

## 2020-04-12 DIAGNOSIS — E119 Type 2 diabetes mellitus without complications: Secondary | ICD-10-CM | POA: Diagnosis not present

## 2020-04-12 DIAGNOSIS — R002 Palpitations: Secondary | ICD-10-CM

## 2020-04-12 DIAGNOSIS — E7800 Pure hypercholesterolemia, unspecified: Secondary | ICD-10-CM | POA: Diagnosis not present

## 2020-04-12 DIAGNOSIS — I209 Angina pectoris, unspecified: Secondary | ICD-10-CM

## 2020-04-12 DIAGNOSIS — Z794 Long term (current) use of insulin: Secondary | ICD-10-CM

## 2020-04-12 DIAGNOSIS — I201 Angina pectoris with documented spasm: Secondary | ICD-10-CM

## 2020-04-12 LAB — COMPREHENSIVE METABOLIC PANEL
ALT: 27 U/L (ref 0–44)
AST: 14 U/L — ABNORMAL LOW (ref 15–41)
Albumin: 4 g/dL (ref 3.5–5.0)
Alkaline Phosphatase: 109 U/L (ref 38–126)
Anion gap: 6 (ref 5–15)
BUN: 12 mg/dL (ref 6–20)
CO2: 25 mmol/L (ref 22–32)
Calcium: 9 mg/dL (ref 8.9–10.3)
Chloride: 100 mmol/L (ref 98–111)
Creatinine, Ser: 0.63 mg/dL (ref 0.44–1.00)
GFR, Estimated: >60 mL/min (ref >60)
Glucose, Bld: 389 mg/dL — ABNORMAL HIGH (ref 70–99)
Potassium: 4.4 mmol/L (ref 3.5–5.1)
Sodium: 131 mmol/L — ABNORMAL LOW (ref 135–145)
Total Bilirubin: 0.6 mg/dL (ref 0.3–1.2)
Total Protein: 7.7 g/dL (ref 6.5–8.1)

## 2020-04-12 LAB — CBC
HCT: 42.8 % (ref 36.0–46.0)
Hemoglobin: 14.6 g/dL (ref 12.0–15.0)
MCH: 29.3 pg (ref 26.0–34.0)
MCHC: 34.1 g/dL (ref 30.0–36.0)
MCV: 85.9 fL (ref 80.0–100.0)
Platelets: 290 10*3/uL (ref 150–400)
RBC: 4.98 MIL/uL (ref 3.87–5.11)
RDW: 11.9 % (ref 11.5–15.5)
WBC: 6.5 10*3/uL (ref 4.0–10.5)
nRBC: 0 % (ref 0.0–0.2)

## 2020-04-12 LAB — TROPONIN I (HIGH SENSITIVITY): Troponin I (High Sensitivity): <2 ng/L (ref <18)

## 2020-04-12 MED ORDER — DILTIAZEM HCL ER COATED BEADS 120 MG PO CP24: 120.0000 mg | ORAL_CAPSULE | Freq: Every day | ORAL | 3 refills | Status: DC

## 2020-04-12 MED ORDER — ROSUVASTATIN CALCIUM 10 MG PO TABS: 10.0000 mg | ORAL_TABLET | Freq: Every day | ORAL | 3 refills | Status: DC

## 2020-04-12 MED ORDER — NITROGLYCERIN 0.4 MG SL SUBL: 0.4000 mg | SUBLINGUAL_TABLET | SUBLINGUAL | 11 refills | Status: DC | PRN

## 2020-04-12 MED ORDER — ASPIRIN EC 81 MG PO TBEC: 81.0000 mg | DELAYED_RELEASE_TABLET | Freq: Every day | ORAL | 11 refills | Status: DC

## 2020-04-12 NOTE — Telephone Encounter (Signed)
Left message to call back  

## 2020-04-12 NOTE — Telephone Encounter (Signed)
Lab results are in Epic.

## 2020-04-12 NOTE — Patient Instructions (Addendum)
Medication Instructions:  1. Start cardizem CD 120 mg, take one tablet by mouth daily. 2. Start rosuvastatin 10 mg, take one tablet by mouth daily. 3. Start taking Aspirin 81 mg by mouth daily 4. Start nitroglycerin 0.4 mg, place one tablet under the tongue as needed for chest pain, max 3 doses, if no relief, call 911 *If you need a refill on your cardiac medications before your next appointment, please call your pharmacy*   Lab Work: CMET, CBC, STAT high sensitivity troponin If you have labs (blood work) drawn today and your tests are completely normal, you will receive your results only by: Marland Kitchen MyChart Message (if you have MyChart) OR . A paper copy in the mail If you have any lab test that is abnormal or we need to change your treatment, we will call you to review the results.   Testing/Procedures: Your physician has requested that you have a limited echocardiogram. Echocardiography is a painless test that uses sound waves to create images of your heart. It provides your doctor with information about the size and shape of your heart and how well your heart's chambers and valves are working. This procedure takes approximately one hour. There are no restrictions for this procedure.     Follow-Up: At [facility], you and your health needs are our priority.  As part of our continuing mission to provide you with exceptional heart care, we have created designated Provider Care Teams.  These Care Teams include your primary Cardiologist (physician) and Advanced Practice Providers (APPs -  Physician Assistants and Nurse Practitioners) who all work together to provide you with the care you need, when you need it.  We recommend signing up for the patient portal called "MyChart".  Sign up information is provided on this After Visit Summary.  MyChart is used to connect with patients for Virtual Visits (Telemedicine).  Patients are able to view lab/test results, encounter notes, upcoming appointments,  etc.  Non-urgent messages can be sent to your provider as well.   To learn more about what you can do with MyChart, go to [email or web address].    Your next appointment:   3-7 days  The format for your next appointment:   In Person  Provider:   Nona Dell, MD, Dina Rich, MD or Nena Polio, NP

## 2020-04-12 NOTE — Telephone Encounter (Signed)
See result note in labs Tereso Newcomer, [place]    04/12/2020 4:47 PM

## 2020-04-12 NOTE — Telephone Encounter (Signed)
Sodium low. Glucose elevated. Creatinine, K+, LFTs, Hgb normal. Troponin normal. Sodium is low due to elevated glucose. Corrected sodium is 136 which is normal. A normal troponin in the setting of ongoing chest symptoms for a number of hours is very reassuring. PLAN:  -Continue medications as prescribed today at office visit -Follow-up as planned -Follow-up with PCP for diabetes Tereso Newcomer, PA-C   04/12/2020 4:43 PM

## 2020-04-12 NOTE — Telephone Encounter (Signed)
Patient husband called wanting the results of her labs that were performed at [facility] today.

## 2020-04-13 ENCOUNTER — Ambulatory Visit (INDEPENDENT_AMBULATORY_CARE_PROVIDER_SITE_OTHER): Payer: BC Managed Care – PPO

## 2020-04-13 ENCOUNTER — Encounter: Payer: Self-pay | Admitting: Physician Assistant

## 2020-04-13 ENCOUNTER — Other Ambulatory Visit: Payer: Self-pay | Admitting: Physician Assistant

## 2020-04-13 DIAGNOSIS — I209 Angina pectoris, unspecified: Secondary | ICD-10-CM | POA: Diagnosis not present

## 2020-04-13 DIAGNOSIS — I201 Angina pectoris with documented spasm: Secondary | ICD-10-CM | POA: Insufficient documentation

## 2020-04-13 LAB — ECHOCARDIOGRAM COMPLETE
AR max vel: 2.89 cm2
AV Area VTI: 3.09 cm2
AV Area mean vel: 2.94 cm2
AV Mean grad: 2.8 mmHg
AV Peak grad: 5.5 mmHg
Ao pk vel: 1.17 m/s
Area-P 1/2: 2.93 cm2
Calc EF: 57.2 %
S' Lateral: 3.42 cm
Single Plane A2C EF: 59.8 %
Single Plane A4C EF: 59.7 %

## 2020-04-20 NOTE — Progress Notes (Deleted)
Cardiology Office Note  Date: 04/20/2020   ID: Debra Mullins, DOB 14-Apr-1980, MRN [id number]  PCP:  Patient, No Pcp Per  Cardiologist:  No primary care provider on file. Electrophysiologist:  None   Chief Complaint: follow up chest pain/palpitations  History of Present Illness: Debra Mullins is a 40 y.o. female with a history of Prinzmetal's angina, NSTEMI 2015 (with cardiac catheterization demonstrating no evidence of CAD per notes), DM, HLD, GERD, obesity.  Last encounter with Tereso Newcomer PA-C 04/12/2020. Complained of chest pain and and palpitations. She had been experiencing fluttering in her chest and chest tightness. She noted it to be worse on the day of exam. She c/o of accompanying SOB. She stated symptoms reminded her of similar symptoms when she had her prior MI 6 years prior. CMET, CBC and Hs Troponin were ordered. Diltiazem CD was 120 mg daily, ASA 81 mg daily and PRN NTG. Echocardiogram. If she continued to have symptoms after initiating anti-anginal therapy would need to consider coronary CTA vs cardiac catheterization. If she continued to have palpitations after initiating CCB may need to consider event monitor. Mr Alben Spittle noted she needed  to establish with PCP. Plans to try Crestor 10 mg daily due to history of myalgias on simvastatin. Plan to get follow up lipid panel in 3 months  Past Medical History:  Diagnosis Date  . Diabetes (HCC)   . Heart attack ([facility]) 2015  . Prinzmetal angina (H[facility])    Echo 2/22: EF 55-60, no RWMA, normal RVSF, no MR    Past Surgical History:  Procedure Laterality Date  . HYSTEROTOMY    . OOPHORECTOMY      Current Outpatient Medications  Medication Sig Dispense Refill  . acetaminophen (TYLENOL) 500 MG tablet Take 500 mg by mouth every 6 (six) hours as needed for mild pain or headache.    Marland Kitchen aspirin EC 81 MG tablet Take 1 tablet (81 mg total) by mouth daily. Swallow whole. 30 tablet 11  . diltiazem (CARDIZEM CD) 120 MG 24 hr capsule Take 1 capsule  (120 mg total) by mouth daily. 90 capsule 3  . Insulin Degludec (TRESIBA [place]) Inject into the skin.    Marland Kitchen nitroGLYCERIN (NITROSTAT) 0.4 MG SL tablet Place 1 tablet (0.4 mg total) under the tongue every 5 (five) minutes as needed for chest pain. 25 tablet 11  . rosuvastatin (CRESTOR) 10 MG tablet Take 1 tablet (10 mg total) by mouth daily. 90 tablet 3   No current facility-administered medications for this visit.   Allergies:  Sulfa antibiotics, Zithromax [azithromycin], and Prednisone   Social History: The patient  reports that she has never smoked. She has never used smokeless tobacco.   Family History: The patient's family history includes CAD in her mother; Heart attack in her paternal aunt.   ROS:  Please see the history of present illness. Otherwise, complete review of systems is positive for {NONE DEFAULTED:18576::"none"}.  All other systems are reviewed and negative.   Physical Exam: VS:  There were no vitals taken for this visit., BMI There is no height or weight on file to calculate BMI.  Wt Readings from Last 3 Encounters:  04/12/20 291 lb 6.4 oz (132.2 kg)  06/17/19 296 lb (134.3 kg)  07/21/18 300 lb (136.1 kg)    General: Patient appears comfortable at rest. HEENT: Conjunctiva and lids normal, oropharynx clear with moist mucosa. Neck: Supple, no elevated JVP or carotid bruits, no thyromegaly. Lungs: Clear to auscultation, nonlabored breathing at rest. Cardiac: Regular  rate and rhythm, no S3 or significant systolic murmur, no pericardial rub. Abdomen: Soft, nontender, no hepatomegaly, bowel sounds present, no guarding or rebound. Extremities: No pitting edema, distal pulses 2+. Skin: Warm and dry. Musculoskeletal: No kyphosis. Neuropsychiatric: Alert and oriented x3, affect grossly appropriate.  ECG:  {EKG/Telemetry Strips Reviewed:[phone]}  Recent Labwork: 04/12/2020: ALT 27; AST 14; BUN 12; Creatinine, Ser 0.63; Hemoglobin 14.6; Platelets 290; Potassium 4.4; Sodium  131  No results found for: CHOL, TRIG, HDL, CHOLHDL, VLDL, LDLCALC, LDLDIRECT  Other Studies Reviewed Today:  Echocardiogram 04/13/2020 1. Left ventricular ejection fraction, by estimation, is 55 to 60%. The left ventricle has normal function. The left ventricle has no regional wall motion abnormalities. Left ventricular diastolic parameters are indeterminate. 2. Right ventricular systolic function is normal. The right ventricular size is normal. 3. The mitral valve is normal in structure. No evidence of mitral valve regurgitation. No evidence of mitral stenosis. 4. The aortic valve is tricuspid. Aortic valve regurgitation is not visualized. No aortic stenosis is present. Comparison(s): Echocardiogram done 07/02/19 showed an EF of 55-60%.  Assessment and Plan:  1. Prinzmetal angina (HCC)   2. Chest tightness   3. Palpitations   4. Uncontrolled type 2 diabetes mellitus with hyperglycemia (HCC)      Medication Adjustments/Labs and Tests Ordered: Current medicines are reviewed at length with the patient today.  Concerns regarding medicines are outlined above.   Disposition: Follow-up with ***  Signed, Rennis Harding, NP 04/20/2020 9:07 PM    [facility] [address] [place], [place], [place] [address] Phone: [phone]; Fax: [phone]

## 2020-04-22 ENCOUNTER — Ambulatory Visit: Payer: BC Managed Care – PPO | Admitting: Family Medicine

## 2020-04-22 DIAGNOSIS — R0789 Other chest pain: Secondary | ICD-10-CM

## 2020-04-22 DIAGNOSIS — R002 Palpitations: Secondary | ICD-10-CM

## 2020-04-22 DIAGNOSIS — I201 Angina pectoris with documented spasm: Secondary | ICD-10-CM

## 2020-04-22 DIAGNOSIS — E1165 Type 2 diabetes mellitus with hyperglycemia: Secondary | ICD-10-CM

## 2020-11-28 ENCOUNTER — Ambulatory Visit (INDEPENDENT_AMBULATORY_CARE_PROVIDER_SITE_OTHER): Payer: 59 | Admitting: Family Medicine

## 2020-11-28 ENCOUNTER — Other Ambulatory Visit (HOSPITAL_COMMUNITY)
Admission: RE | Admit: 2020-11-28 | Discharge: 2020-11-28 | Disposition: A | Discharge status: Home / Self Care | Payer: 59 | Source: Ambulatory Visit | Attending: Family Medicine | Admitting: Family Medicine

## 2020-11-28 ENCOUNTER — Encounter: Payer: Self-pay | Admitting: Family Medicine

## 2020-11-28 ENCOUNTER — Encounter: Payer: Self-pay | Admitting: *Deleted

## 2020-11-28 ENCOUNTER — Other Ambulatory Visit: Payer: Self-pay

## 2020-11-28 VITALS — BP 120/90 | HR 77 | Ht 71.0 in | Wt 294.4 lb

## 2020-11-28 DIAGNOSIS — R079 Chest pain, unspecified: Secondary | ICD-10-CM | POA: Diagnosis not present

## 2020-11-28 DIAGNOSIS — R0602 Shortness of breath: Secondary | ICD-10-CM

## 2020-11-28 DIAGNOSIS — R002 Palpitations: Secondary | ICD-10-CM | POA: Diagnosis not present

## 2020-11-28 DIAGNOSIS — E119 Type 2 diabetes mellitus without complications: Secondary | ICD-10-CM

## 2020-11-28 DIAGNOSIS — Z794 Long term (current) use of insulin: Secondary | ICD-10-CM

## 2020-11-28 DIAGNOSIS — E782 Mixed hyperlipidemia: Secondary | ICD-10-CM | POA: Diagnosis not present

## 2020-11-28 LAB — D-DIMER, QUANTITATIVE: D-Dimer, Quant: 0.34 ug/mL-FEU (ref 0.00–0.50)

## 2020-11-28 NOTE — Progress Notes (Signed)
Cardiology Office Note  Date: 11/28/2020   ID: Debra Mullins, DOB 1980/04/18, MRN [id number]  PCP:  Patient, No Pcp Per (Inactive)  Cardiologist:  None Electrophysiologist:  None   Chief Complaint: Chest heaviness, shortness of breath, chest pain  History of Present Illness: Debra Mullins is a 40 y.o. female with a history of CAD, MI, Prinzmetal's angina, diabetes, HLD, GERD, obesity  History of NSTEMI 2015 according to notes.  Cath at [facility] in [place] and without blockages.   Last saw Tereso Newcomer, PA on 04/12/2020 for complaints of anginal symptoms. She had previously been evaluated by Dr. Purvis Sheffield on 06/2019 for chest pain.  History of Prinzmetal's angina.  Previous cath 2015 in setting of NSTEMI in [place] with no evidence of CAD.  Records were to be obtained but no cath records could be located in her chart.  Follow-up Myoview was low risk.  Echocardiogram with normal EF.  She had called in recently with chest pain and palpitations.  Over the prior several days she had noted fluttering in her chest as well as chest tightness.  It seemed to be worse on the day of the visit and had been fairly constant for the prior 24 hours.  She noted some shortness of breath associated.  She stated symptoms reminded her of symptoms she had after severe chest pain with her prior heart attack 6 years prior.  She was started on diltiazem CD1 120 mg daily, aspirin 81 mg, as needed nitroglycerin and Crestor 10 mg daily.  Echocardiogram was ordered to reassess EF and rule out wall motion abnormalities.  She was on insulin therapy for diabetes.  She noted poor control.  She needed to establish with a primary care provider.  She did mention myalgias with simvastatin in the past.  Plan was to try Crestor and arrange fasting lipids and LFTs in 3 months  He is having recent complaints of chest pain/chest heaviness with associated shortness of breath and occasional palpitations over the last few days.   She states last evening symptoms appeared to be worse.  Having some occasional pains in her left arm.  Also complained of some recent nausea associated with the chest pain.  Having issues with shortness of breath when active with more than usual activity..  She states that she recently started a new job about 3 months ago and is unsure if stress is playing a role.  States she has occasional dizziness with some associated palpitations.  She denies any CVA or TIA-like symptoms, orthostatic symptoms other than occasional lightheadedness.  Denies any presyncopal or syncopal episodes.  No PND or orthopnea.  Weight is up approximately 3 pounds since visit with Mr. Alben Spittle PA on 05/10/2020.  Denies any lower extremity edema, claudication-like symptoms, DVT or PE-like symptoms.  She reported recent episodes of shaking and chills at home.  She denies any respiratory symptoms or gastrointestinal symptoms.    Past Medical History:  Diagnosis Date   Diabetes (HCC)    Heart attack (HCC) 2015   Prinzmetal angina (HCC)    Echo 2/22: EF 55-60, no RWMA, normal RVSF, no MR    Past Surgical History:  Procedure Laterality Date   HYSTEROTOMY     OOPHORECTOMY      Current Outpatient Medications  Medication Sig Dispense Refill   acetaminophen (TYLENOL) 500 MG tablet Take 500 mg by mouth every 6 (six) hours as needed for mild pain or headache.     diltiazem (CARDIZEM CD) 120 MG  24 hr capsule Take 1 capsule (120 mg total) by mouth daily. 90 capsule 3   Insulin Degludec (TRESIBA Kirkpatrick) Inject into the skin.     nitroGLYCERIN (NITROSTAT) 0.4 MG SL tablet Place 1 tablet (0.4 mg total) under the tongue every 5 (five) minutes as needed for chest pain. 25 tablet 11   rosuvastatin (CRESTOR) 10 MG tablet Take 1 tablet (10 mg total) by mouth daily. 90 tablet 3   No current facility-administered medications for this visit.   Allergies:  Sulfa antibiotics, Zithromax [azithromycin], and Prednisone   Social History: The patient   reports that she has never smoked. She has never used smokeless tobacco.   Family History: The patient's family history includes CAD in her mother; Heart attack in her paternal aunt.   ROS:  Please see the history of present illness. Otherwise, complete review of systems is positive for none.  All other systems are reviewed and negative.   Physical Exam: VS:  BP 120/90 (BP Location: Right Arm, Patient Position: Sitting, Cuff Size: Large)   Pulse 77   Ht 5\' 11"  (1.803 m)   Wt 294 lb 6.4 oz (133.5 kg)   SpO2 98%   BMI 41.06 kg/m , BMI Body mass index is 41.06 kg/m.  Wt Readings from Last 3 Encounters:  11/28/20 294 lb 6.4 oz (133.5 kg)  04/12/20 291 lb 6.4 oz (132.2 kg)  06/17/19 296 lb (134.3 kg)    General: Patient appears comfortable at rest. Neck: Supple, no elevated JVP or carotid bruits, no thyromegaly. Lungs: Clear to auscultation, nonlabored breathing at rest. Cardiac: Regular rate and rhythm, no S3 or significant systolic murmur, no pericardial rub. Extremities: No pitting edema, distal pulses 2+. Skin: Warm and dry. Musculoskeletal: No kyphosis. Neuropsychiatric: Alert and oriented x3, affect grossly appropriate.  ECG: 11/28/2020 EKG normal sinus rhythm rate of 77 complaining of chest pain/chest heaviness  Recent Labwork: 04/12/2020: ALT 27; AST 14; BUN 12; Creatinine, Ser 0.63; Hemoglobin 14.6; Platelets 290; Potassium 4.4; Sodium 131  No results found for: CHOL, TRIG, HDL, CHOLHDL, VLDL, LDLCALC, LDLDIRECT  Other Studies Reviewed Today:  Echocardiogram April 13, 2020 1. Left ventricular ejection fraction, by estimation, is 55 to 60%. The left ventricle has normal function. The left ventricle has no regional wall motion abnormalities. Left ventricular diastolic parameters are indeterminate. 2. Right ventricular systolic function is normal. The right ventricular size is normal. 3. The mitral valve is normal in structure. No evidence of mitral valve regurgitation.  No evidence of mitral stenosis. 4. The aortic valve is tricuspid. Aortic valve regurgitation is not visualized. No aortic stenosis is present. Comparison(s): Echocardiogram done 07/02/19 showed an EF of 55-60%.     Nuclear stress test 07/02/2019 Study Result  Narrative & Impression  There was no ST segment deviation noted during stress. The study is normal. Reverse redistribution is seen. There are no ischemic territories and no myocardial scar. This is a low risk study. Nuclear stress EF: 54%      Assessment and Plan:  1. Chest pain of uncertain etiology   2. Shortness of breath   3. Mixed hyperlipidemia   4. Palpitations   5. Type 2 diabetes mellitus without complication, with long-term current use of insulin (HCC)   6. Chest pain, unspecified type    1. Chest pain of uncertain etiology Complaining of recent issues with chest pain/chest heaviness with radiation through to her back.  She states it was worse yesterday with some associated nausea.  She does have a  history of Prinzmetal's angina and previous MI.  She was seen in February by Tereso Newcomer, PA.  EKG today shows normal sinus rhythm with a rate of 77, no acute ischemic changes noted.  Please get a Lexiscan stress test.  Get D-dimer to rule out PE.  Get troponin to rule out ACS.  Continue sublingual nitroglycerin as needed.  2. Shortness of breath Complaining of increasing dyspnea on exertion with minimal to moderate activity.  She had a previous echocardiogram  February 2022 with EF of 50 to 55%.  No WMA's.  Indeterminate diastolic parameters.  3. Mixed hyperlipidemia Continue Crestor 10 mg p.o. daily  4. Palpitations Complaining of occasional palpitations with some associated lightheadedness when they occur.  Continue diltiazem 120 mg p.o. daily.  5. Type 2 diabetes mellitus without complication, with long-term current use of insulin (HCC) On Tresiba.  Has no PCP  Medication Adjustments/Labs and Tests  Ordered: Current medicines are reviewed at length with the patient today.  Concerns regarding medicines are outlined above.   Disposition: Follow-up with Dr. Wyline Mood or APP 3 months  Signed, Rennis Harding, NP 11/28/2020 11:49 AM    [facility] at [facility] [address] [place], [place], [place] [address] Phone: [phone]; Fax: [phone]

## 2020-11-28 NOTE — Patient Instructions (Addendum)
Medication Instructions:  Continue all current medications.  Labwork: Troponin, D-Dimer - orders given today.  Office will contact with results via phone or letter.     Testing/Procedures: Your physician has requested that you have a lexiscan myoview. For further information please visit [email or web address] Please follow instruction sheet, as given. Office will contact with results via phone or letter.     Follow-Up: 3 months   Any Other Special Instructions Will Be Listed Below (If Applicable).   If you need a refill on your cardiac medications before your next appointment, please call your pharmacy.

## 2020-11-29 ENCOUNTER — Telehealth: Payer: Self-pay | Admitting: Family Medicine

## 2020-11-29 NOTE — Telephone Encounter (Signed)
Left message asking patient to call the [facility].  We need updated information with her insurance in order to precert her upcoming stress testing.

## 2020-11-30 LAB — TROPONIN T: Troponin T (Highly Sensitive): 13 ng/L (ref 0–14)

## 2020-12-05 ENCOUNTER — Telehealth: Payer: Self-pay | Admitting: *Deleted

## 2020-12-05 NOTE — Telephone Encounter (Signed)
D-DIMER -  D dimer looks good. Might wait for the Troponin result before you call her in case it is elevated.   Netta Neat, NP  11/28/2020 2:08 PM    TROPONIN -  Troponin was negative. No evidence of heart damage by this lab result. Netta Neat, NP  11/30/2020 2:00 PM

## 2020-12-05 NOTE — Telephone Encounter (Signed)
Patient notified, does not have pcp.  She has cancelled stress test - will call back when able to do so.

## 2020-12-07 ENCOUNTER — Encounter (HOSPITAL_COMMUNITY): Payer: Self-pay

## 2021-02-23 ENCOUNTER — Ambulatory Visit: Payer: Self-pay | Admitting: Cardiology

## 2021-02-23 DIAGNOSIS — E1159 Type 2 diabetes mellitus with other circulatory complications: Secondary | ICD-10-CM | POA: Insufficient documentation

## 2021-02-23 DIAGNOSIS — E782 Mixed hyperlipidemia: Secondary | ICD-10-CM | POA: Insufficient documentation

## 2021-02-23 DIAGNOSIS — R0602 Shortness of breath: Secondary | ICD-10-CM | POA: Insufficient documentation

## 2021-02-23 DIAGNOSIS — R079 Chest pain, unspecified: Secondary | ICD-10-CM | POA: Insufficient documentation

## 2021-02-23 DIAGNOSIS — R002 Palpitations: Secondary | ICD-10-CM | POA: Insufficient documentation

## 2021-02-23 DIAGNOSIS — Z794 Long term (current) use of insulin: Secondary | ICD-10-CM | POA: Insufficient documentation

## 2021-02-23 NOTE — Assessment & Plan Note (Deleted)
Continue Crestor 10 mg p.o. daily 

## 2021-02-23 NOTE — Assessment & Plan Note (Deleted)
Previously complained of increasing dyspnea on exertion with minimal to moderate activity.  She had a previous echocardiogram  February 2022 with EF of 50 to 55%.  No WMA's.  Indeterminate diastolic parameters.

## 2021-02-23 NOTE — Assessment & Plan Note (Deleted)
Complaining of occasional palpitations with some associated lightheadedness when they occur.  Continue diltiazem 120 mg p.o. daily.

## 2021-02-23 NOTE — Assessment & Plan Note (Deleted)
9/22 visit as above. Reassuring. Prior NUC 2021 low risk

## 2021-02-23 NOTE — Progress Notes (Deleted)
Cardiology Office Note:    Date:  02/23/2021   ID:  Debra Mullins, DOB 1981-02-27, MRN [id number]  PCP:  Patient, No Pcp Per (Inactive)   [facility] Providers Cardiologist:  None { Click to update primary MD,subspecialty MD or APP then REFRESH:1}    Referring MD: No ref. provider found    History of Present Illness:    Debra Mullins is a 40 y.o. female here for follow up chest pain with prior Prinzmetal angina, normal cath in 2015 in [facility] in [facility]  in setting of NSTEMI with follow up NUC low risk.  Prior visit 11/2020 with chest pain, D Dimer and Trop normal. No stress test.   Past Medical History:  Diagnosis Date   Diabetes (HCC)    Heart attack (HCC) 2015   Prinzmetal angina (HCC)    Echo 2/22: EF 55-60, no RWMA, normal RVSF, no MR    Past Surgical History:  Procedure Laterality Date   HYSTEROTOMY     OOPHORECTOMY      Current Medications: No outpatient medications have been marked as taking for the 02/23/21 encounter (Appointment) with Debra Bathe, MD.     Allergies:   Sulfa antibiotics, Zithromax [azithromycin], and Prednisone   Social History   Socioeconomic History   Marital status: Married    Spouse name: Not on file   Number of children: Not on file   Years of education: Not on file   Highest education level: Not on file  Occupational History   Not on file  Tobacco Use   Smoking status: Never   Smokeless tobacco: Never  Substance and Sexual Activity   Alcohol use: Not on file   Drug use: Not on file   Sexual activity: Not on file  Other Topics Concern   Not on file  Social History Narrative   Not on file   Social Determinants of Health   Financial Resource Strain: Not on file  Food Insecurity: Not on file  Transportation Needs: Not on file  Physical Activity: Not on file  Stress: Not on file  Social Connections: Not on file     Family History: The patient's ***family history includes CAD in her mother; Heart attack  in her paternal aunt.  ROS:   Please see the history of present illness.    *** All other systems reviewed and are negative.  EKGs/Labs/Other Studies Reviewed:    The following studies were reviewed today: Echocardiogram April 13, 2020 1. Left ventricular ejection fraction, by estimation, is 55 to 60%. The left ventricle has normal function. The left ventricle has no regional wall motion abnormalities. Left ventricular diastolic parameters are indeterminate. 2. Right ventricular systolic function is normal. The right ventricular size is normal. 3. The mitral valve is normal in structure. No evidence of mitral valve regurgitation. No evidence of mitral stenosis. 4. The aortic valve is tricuspid. Aortic valve regurgitation is not visualized. No aortic stenosis is present. Comparison(s): Echocardiogram done 07/02/19 showed an EF of 55-60%.         Nuclear stress test 07/02/2019 Study Result   Narrative & Impression  There was no ST segment deviation noted during stress. The study is normal. Reverse redistribution is seen. There are no ischemic territories and no myocardial scar. This is a low risk study. Nuclear stress EF: 54%      EKG:  EKG is *** ordered today.  The ekg ordered today demonstrates *** Prior  11/28/2020 EKG normal sinus rhythm rate of  77 complaining of chest pain/chest heaviness  Recent Labs: 04/12/2020: ALT 27; BUN 12; Creatinine, Ser 0.63; Hemoglobin 14.6; Platelets 290; Potassium 4.4; Sodium 131  Recent Lipid Panel No results found for: CHOL, TRIG, HDL, CHOLHDL, VLDL, LDLCALC, LDLDIRECT   Risk Assessment/Calculations:   {Does this patient have ATRIAL FIBRILLATION?:[phone]}           Physical Exam:    VS:  There were no vitals taken for this visit.    Wt Readings from Last 3 Encounters:  11/28/20 294 lb 6.4 oz (133.5 kg)  04/12/20 291 lb 6.4 oz (132.2 kg)  06/17/19 296 lb (134.3 kg)     GEN: *** Well nourished, well developed in no acute  distress HEENT: Normal NECK: No JVD; No carotid bruits LYMPHATICS: No lymphadenopathy CARDIAC: ***RRR, no murmurs, no rubs, gallops RESPIRATORY:  Clear to auscultation without rales, wheezing or rhonchi  ABDOMEN: Soft, non-tender, non-distended MUSCULOSKELETAL:  No edema; No deformity  SKIN: Warm and dry NEUROLOGIC:  Alert and oriented x 3 PSYCHIATRIC:  Normal affect   ASSESSMENT:    1. Chest pain of uncertain etiology   2. Shortness of breath   3. Mixed hyperlipidemia   4. Palpitations   5. Type 2 diabetes mellitus with complication, with long-term current use of insulin (HCC)    PLAN:    In order of problems listed above:  Chest pain of uncertain etiology 9/22 visit as above. Reassuring. Prior NUC 2021 low risk  Shortness of breath Previously complained of increasing dyspnea on exertion with minimal to moderate activity.  She had a previous echocardiogram  February 2022 with EF of 50 to 55%.  No WMA's.  Indeterminate diastolic parameters.  Mixed hyperlipidemia Continue Crestor 10 mg p.o. daily  Palpitations Complaining of occasional palpitations with some associated lightheadedness when they occur.  Continue diltiazem 120 mg p.o. daily.  Type 2 diabetes mellitus with complication, with long-term current use of insulin (HCC) On insulin Degludec Evaristo Mullins ). No PCP     {Are you ordering a CV Procedure (e.g. stress test, cath, DCCV, TEE, etc)?   Press F2        :[id number]}    Medication Adjustments/Labs and Tests Ordered: Current medicines are reviewed at length with the patient today.  Concerns regarding medicines are outlined above.  No orders of the defined types were placed in this encounter.  No orders of the defined types were placed in this encounter.   There are no Patient Instructions on file for this visit.   Signed, Donato Schultz, MD  02/23/2021 6:51 AM    [facility]

## 2021-02-23 NOTE — Assessment & Plan Note (Deleted)
On insulin Degludec Evaristo [place]). No PCP

## 2022-03-09 ENCOUNTER — Ambulatory Visit (INDEPENDENT_AMBULATORY_CARE_PROVIDER_SITE_OTHER): Payer: 59 | Admitting: Family Medicine

## 2022-03-09 DIAGNOSIS — R251 Tremor, unspecified: Secondary | ICD-10-CM

## 2022-03-09 DIAGNOSIS — E118 Type 2 diabetes mellitus with unspecified complications: Secondary | ICD-10-CM

## 2022-03-09 DIAGNOSIS — R1013 Epigastric pain: Secondary | ICD-10-CM

## 2022-03-09 DIAGNOSIS — E782 Mixed hyperlipidemia: Secondary | ICD-10-CM

## 2022-03-09 DIAGNOSIS — Z794 Long term (current) use of insulin: Secondary | ICD-10-CM

## 2022-03-09 DIAGNOSIS — K219 Gastro-esophageal reflux disease without esophagitis: Secondary | ICD-10-CM

## 2022-03-09 DIAGNOSIS — Z130 Encounter for screening for diseases of the blood and blood-forming organs and certain disorders involving the immune mechanism: Secondary | ICD-10-CM

## 2022-03-09 DIAGNOSIS — E669 Obesity, unspecified: Secondary | ICD-10-CM

## 2022-03-09 MED ORDER — PANTOPRAZOLE SODIUM 40 MG PO TBEC: 40.0000 mg | DELAYED_RELEASE_TABLET | Freq: Every day | ORAL | 1 refills | Status: DC

## 2022-03-09 NOTE — Patient Instructions (Addendum)
Labs today.  Medication as prescribed.   We will call with the results.  Take care  Dr. Adriana Simas

## 2022-03-10 LAB — CBC
Hematocrit: 41.7 % (ref 34.0–46.6)
Hemoglobin: 14 g/dL (ref 11.1–15.9)
MCH: 29.7 pg (ref 26.6–33.0)
MCHC: 33.6 g/dL (ref 31.5–35.7)
MCV: 88 fL (ref 79–97)
Platelets: 333 10*3/uL (ref 150–450)
RBC: 4.72 x10E6/uL (ref 3.77–5.28)
RDW: 12 % (ref 11.7–15.4)
WBC: 6.9 10*3/uL (ref 3.4–10.8)

## 2022-03-10 LAB — CMP14+EGFR
ALT: 24 IU/L (ref 0–32)
AST: 12 IU/L (ref 0–40)
Albumin/Globulin Ratio: 1.6 (ref 1.2–2.2)
Albumin: 4.5 g/dL (ref 3.9–4.9)
Alkaline Phosphatase: 105 IU/L (ref 44–121)
BUN/Creatinine Ratio: 16 (ref 9–23)
BUN: 10 mg/dL (ref 6–24)
Bilirubin Total: 0.3 mg/dL (ref 0.0–1.2)
CO2: 24 mmol/L (ref 20–29)
Calcium: 9.6 mg/dL (ref 8.7–10.2)
Chloride: 99 mmol/L (ref 96–106)
Creatinine, Ser: 0.62 mg/dL (ref 0.57–1.00)
Globulin, Total: 2.9 g/dL (ref 1.5–4.5)
Glucose: 299 mg/dL — ABNORMAL HIGH (ref 70–99)
Potassium: 4.4 mmol/L (ref 3.5–5.2)
Sodium: 136 mmol/L (ref 134–144)
Total Protein: 7.4 g/dL (ref 6.0–8.5)
eGFR: 113 mL/min/{1.73_m2} (ref >59)

## 2022-03-10 LAB — LIPID PANEL
Chol/HDL Ratio: 5.9 ratio — ABNORMAL HIGH (ref 0.0–4.4)
Cholesterol, Total: 249 mg/dL — ABNORMAL HIGH (ref 100–199)
HDL: 42 mg/dL (ref >39)
LDL Chol Calc (NIH): 164 mg/dL — ABNORMAL HIGH (ref 0–99)
Triglycerides: 233 mg/dL — ABNORMAL HIGH (ref 0–149)
VLDL Cholesterol Cal: 43 mg/dL — ABNORMAL HIGH (ref 5–40)

## 2022-03-10 LAB — TSH: TSH: 1.34 u[IU]/mL (ref 0.450–4.500)

## 2022-03-10 LAB — HEMOGLOBIN A1C
Est. average glucose Bld gHb Est-mCnc: 301 mg/dL
Hgb A1c MFr Bld: 12.1 % — ABNORMAL HIGH (ref 4.8–5.6)

## 2022-03-12 DIAGNOSIS — R1013 Epigastric pain: Secondary | ICD-10-CM | POA: Insufficient documentation

## 2022-03-12 DIAGNOSIS — R251 Tremor, unspecified: Secondary | ICD-10-CM | POA: Insufficient documentation

## 2022-03-12 DIAGNOSIS — K219 Gastro-esophageal reflux disease without esophagitis: Secondary | ICD-10-CM | POA: Insufficient documentation

## 2022-03-12 LAB — MICROALBUMIN / CREATININE URINE RATIO
Creatinine, Urine: 65.3 mg/dL
Microalb/Creat Ratio: 39 mg/g creat — ABNORMAL HIGH (ref 0–29)
Microalbumin, Urine: 25.2 ug/mL

## 2022-03-12 NOTE — Assessment & Plan Note (Signed)
Etiology unclear... ? Sleep paralysis.  No consistent with seizure.  Referring to Neurology.

## 2022-03-12 NOTE — Assessment & Plan Note (Signed)
Uncontrolled. Needs statin therapy.

## 2022-03-12 NOTE — Progress Notes (Signed)
Subjective:  Patient ID: Debra Mullins, female    DOB: 20-Sep-1980  Age: 42 y.o. MRN: [id number]  CC: Chief Complaint  Patient presents with   Establish Care    Patient states she is having stomach issues and pain in stomach for 1.5 weeks Patient also not taking her cardizem and had spell of palpitations last week- needs to get script Patient also diabetic and no longer on her insulin or cholesterol med    HPI:  42 year old female with reported Prinzmetal angina, DM-2, HLD presents to establish care.  Patient reports hx of MI secondary to vasospasm (Prinzmetal angina). States cath was negative.  On no pharmacotherapy for diabetes. Last time she check her blood sugar it was ~ 400. Was previously on insulin. Does not tolerate metformin. Needs labs.  Patient reports ongoing GI issues for the past 1.5 weeks. She reports heartburn and upper abdominal pain. Worse with certain foods. Has been using OTC Omeprazole.   Patient also reports that she has had 3 or 4 occurrences of diffuse shaking with inability to move. Recently had an episode and had difficulty swallowing. She is awake and alert when it occurs. She does state that these events seem to occur with sleep (falling asleep). No reports of incontinence. Last for a few minutes then resolve.  Patient Active Problem List   Diagnosis Date Noted   Epigastric pain 03/12/2022   GERD (gastroesophageal reflux disease) 03/12/2022   Episode of shaking 03/12/2022   Mixed hyperlipidemia 02/23/2021   Type 2 diabetes mellitus with complication, with long-term current use of insulin ([place]) 02/23/2021   Prinzmetal angina (HCC)     Social Hx   Social History   Socioeconomic History   Marital status: Married    Spouse name: Not on file   Number of children: Not on file   Years of education: Not on file   Highest education level: Not on file  Occupational History   Not on file  Tobacco Use   Smoking status: Never   Smokeless tobacco: Never   Substance and Sexual Activity   Alcohol use: Not on file   Drug use: Not on file   Sexual activity: Not on file  Other Topics Concern   Not on file  Social History Narrative   Not on file   Social Determinants of Health   Financial Resource Strain: Not on file  Food Insecurity: Not on file  Transportation Needs: Not on file  Physical Activity: Not on file  Stress: Not on file  Social Connections: Not on file    Review of Systems Per HPI  Objective:  BP 122/77   Pulse 85   Ht _0  (1.803 m)   Wt 284 lb 9.6 oz (129.1 kg)   BMI 39.69 kg/m      03/09/2022    3:12 PM 11/28/2020   11:11 AM 04/12/2020    8:53 AM  BP/Weight  Systolic BP 749 449 675  Diastolic BP 77 90 94  Wt. (Lbs) 284.6 294.4 291.4  BMI 39.69 kg/m2 41.06 kg/m2 40.64 kg/m2    Physical Exam Constitutional:      General: She is not in acute distress.    Appearance: Normal appearance. She is obese.  HENT:     Head: Normocephalic and atraumatic.  Eyes:     General:        Right eye: No discharge.        Left eye: No discharge.     Conjunctiva/sclera: Conjunctivae normal.  Cardiovascular:     Rate and Rhythm: Normal rate and regular rhythm.  Pulmonary:     Effort: Pulmonary effort is normal.     Breath sounds: Normal breath sounds. No wheezing or rales.  Abdominal:     General: There is no distension.     Palpations: Abdomen is soft.     Comments: Tenderness to palpation in the epigastric region.  Neurological:     Mental Status: She is alert.  Psychiatric:        Mood and Affect: Mood normal.        Behavior: Behavior normal.     Lab Results  Component Value Date   WBC 6.9 03/09/2022   HGB 14.0 03/09/2022   HCT 41.7 03/09/2022   PLT 333 03/09/2022   GLUCOSE 299 (H) 03/09/2022   CHOL 249 (H) 03/09/2022   TRIG 233 (H) 03/09/2022   HDL 42 03/09/2022   LDLCALC 164 (H) 03/09/2022   ALT 24 03/09/2022   AST 12 03/09/2022   NA 136 03/09/2022   K 4.4 03/09/2022   CL 99 03/09/2022    CREATININE 0.62 03/09/2022   BUN 10 03/09/2022   CO2 24 03/09/2022   TSH 1.340 03/09/2022   HGBA1C 12.1 (H) 03/09/2022     Assessment & Plan:   Problem List Items Addressed This Visit       Digestive   GERD (gastroesophageal reflux disease)    Treating with Protonix. If symptoms continue to persist, will need referral to GI for EGD.      Relevant Medications   pantoprazole (PROTONIX) 40 MG tablet     Endocrine   Type 2 diabetes mellitus with complication, with long-term current use of insulin ([place])    Labs returned. A1C 12.1. Recommend starting GLP 1 + Insulin. Patient can consider referral to Endo.      Relevant Orders   CMP14+EGFR (Completed)   Hemoglobin A1c (Completed)   Microalbumin / creatinine urine ratio (Completed)     Other   Epigastric pain   Episode of shaking    Etiology unclear... ? Sleep paralysis.  No consistent with seizure.  Referring to Neurology.       Mixed hyperlipidemia    Uncontrolled. Needs statin therapy.      Relevant Orders   Lipid panel (Completed)   Other Visit Diagnoses     Screening for deficiency anemia       Relevant Orders   CBC (Completed)   Obesity (BMI 30-39.9)       Relevant Orders   TSH (Completed)       Meds ordered this encounter  Medications   pantoprazole (PROTONIX) 40 MG tablet    Sig: Take 1 tablet (40 mg total) by mouth daily.    Dispense:  90 tablet    Refill:  1    Follow-up:  In the next 2 weeks given lab results.  [place]

## 2022-03-12 NOTE — Assessment & Plan Note (Signed)
Labs returned. A1C 12.1. Recommend starting GLP 1 + Insulin. Patient can consider referral to Endo.

## 2022-03-12 NOTE — Assessment & Plan Note (Signed)
Treating with Protonix. If symptoms continue to persist, will need referral to GI for EGD.

## 2022-03-13 ENCOUNTER — Other Ambulatory Visit: Payer: Self-pay | Admitting: Family Medicine

## 2022-03-13 ENCOUNTER — Encounter: Payer: Self-pay | Admitting: Neurology

## 2022-03-13 DIAGNOSIS — E118 Type 2 diabetes mellitus with unspecified complications: Secondary | ICD-10-CM

## 2022-03-13 MED ORDER — ATORVASTATIN CALCIUM 40 MG PO TABS: 40.0000 mg | ORAL_TABLET | Freq: Every day | ORAL | 3 refills | Status: DC

## 2022-03-13 MED ORDER — INSULIN PEN NEEDLE 31G X 5 MM MISC: 1 refills | Status: DC

## 2022-03-13 MED ORDER — INSULIN GLARGINE 100 UNIT/ML SOLOSTAR PEN: 15.0000 [IU] | PEN_INJECTOR | Freq: Every day | SUBCUTANEOUS | 1 refills | Status: DC

## 2022-03-21 ENCOUNTER — Other Ambulatory Visit: Payer: Self-pay | Admitting: *Deleted

## 2022-03-21 MED ORDER — ATORVASTATIN CALCIUM 40 MG PO TABS: 40.0000 mg | ORAL_TABLET | Freq: Every day | ORAL | 0 refills | Status: DC

## 2022-03-21 MED ORDER — PANTOPRAZOLE SODIUM 40 MG PO TBEC: 40.0000 mg | DELAYED_RELEASE_TABLET | Freq: Every day | ORAL | 0 refills | Status: DC

## 2022-03-21 MED ORDER — INSULIN GLARGINE 100 UNIT/ML SOLOSTAR PEN: 15.0000 [IU] | PEN_INJECTOR | Freq: Every day | SUBCUTANEOUS | 0 refills | Status: DC

## 2022-03-22 ENCOUNTER — Other Ambulatory Visit: Payer: Self-pay

## 2022-03-22 MED ORDER — ATORVASTATIN CALCIUM 40 MG PO TABS: 40.0000 mg | ORAL_TABLET | Freq: Every day | ORAL | 0 refills | Status: DC

## 2022-03-22 MED ORDER — PANTOPRAZOLE SODIUM 40 MG PO TBEC: 40.0000 mg | DELAYED_RELEASE_TABLET | Freq: Every day | ORAL | 0 refills | Status: DC

## 2022-03-22 MED ORDER — INSULIN GLARGINE 100 UNIT/ML SOLOSTAR PEN: 15.0000 [IU] | PEN_INJECTOR | Freq: Every day | SUBCUTANEOUS | 1 refills | Status: DC

## 2022-03-27 ENCOUNTER — Encounter: Payer: Self-pay | Admitting: Family Medicine

## 2022-03-27 ENCOUNTER — Ambulatory Visit (INDEPENDENT_AMBULATORY_CARE_PROVIDER_SITE_OTHER): Payer: 59 | Admitting: Family Medicine

## 2022-03-27 DIAGNOSIS — H6993 Unspecified Eustachian tube disorder, bilateral: Secondary | ICD-10-CM

## 2022-03-27 DIAGNOSIS — H6990 Unspecified Eustachian tube disorder, unspecified ear: Secondary | ICD-10-CM | POA: Insufficient documentation

## 2022-03-27 DIAGNOSIS — L089 Local infection of the skin and subcutaneous tissue, unspecified: Secondary | ICD-10-CM | POA: Insufficient documentation

## 2022-03-27 DIAGNOSIS — Z794 Long term (current) use of insulin: Secondary | ICD-10-CM | POA: Diagnosis not present

## 2022-03-27 DIAGNOSIS — E118 Type 2 diabetes mellitus with unspecified complications: Secondary | ICD-10-CM

## 2022-03-27 MED ORDER — INSULIN GLARGINE 100 UNIT/ML SOLOSTAR PEN: 15.0000 [IU] | PEN_INJECTOR | Freq: Every day | SUBCUTANEOUS | 1 refills | Status: DC

## 2022-03-27 MED ORDER — CEPHALEXIN 500 MG PO CAPS: 500.0000 mg | ORAL_CAPSULE | Freq: Four times a day (QID) | ORAL | 0 refills | Status: AC

## 2022-03-27 NOTE — Assessment & Plan Note (Signed)
Printed prescription given for Lantus.  I advised her to use [facility].  This should be available locally without any issue.

## 2022-03-27 NOTE — Patient Instructions (Signed)
Antibiotic as prescribed.  Xray at the hospital.  Take care  Dr. Lacinda Axon

## 2022-03-27 NOTE — Progress Notes (Signed)
Subjective:  Patient ID: Debra Mullins, female    DOB: 02-28-1981  Age: 42 y.o. MRN: [id number]  CC: Chief Complaint  Patient presents with   ear pain     Left and right ear popping x since thursday    HPI:  42 year old female presents for evaluation of the above.  Patient reports that since Thursday she has had some discomfort of the right ear.  She is also had some discomfort in the left ear.  Has been popping.  No recent respiratory symptoms.  Mild in severity.  Patient has yet to get her insulin.  There has been an issue with getting it at her pharmacy.  Patient also reports an ongoing issue with her left second toe.  She has a callus to the distal aspect of the left second toe.  She now has surrounding erythema.  She has uncontrolled diabetic.  She is concerned about infection.   Patient Active Problem List   Diagnosis Date Noted   Toe infection 03/27/2022   Eustachian tube dysfunction 03/27/2022   Epigastric pain 03/12/2022   GERD (gastroesophageal reflux disease) 03/12/2022   Episode of shaking 03/12/2022   Mixed hyperlipidemia 02/23/2021   Type 2 diabetes mellitus with complication, with long-term current use of insulin ([place]) 02/23/2021   Prinzmetal angina (HCC)     Social Hx   Social History   Socioeconomic History   Marital status: Married    Spouse name: Not on file   Number of children: Not on file   Years of education: Not on file   Highest education level: Not on file  Occupational History   Not on file  Tobacco Use   Smoking status: Never   Smokeless tobacco: Never  Substance and Sexual Activity   Alcohol use: Not on file   Drug use: Not on file   Sexual activity: Not on file  Other Topics Concern   Not on file  Social History Narrative   Not on file   Social Determinants of Health   Financial Resource Strain: Not on file  Food Insecurity: Not on file  Transportation Needs: Not on file  Physical Activity: Not on file  Stress: Not on file   Social Connections: Not on file    Review of Systems Per HPI  Objective:  BP 132/83   Pulse 88   Temp (!) 97.2 F (36.2 C)   Ht 5\' 11"  (1.803 m)   Wt 285 lb (129.3 kg)   SpO2 99%   BMI 39.75 kg/m      03/27/2022    4:18 PM 03/09/2022    3:12 PM 11/28/2020   11:11 AM  BP/Weight  Systolic BP 314 970 263  Diastolic BP 83 77 90  Wt. (Lbs) 285 284.6 294.4  BMI 39.75 kg/m2 39.69 kg/m2 41.06 kg/m2    Physical Exam Constitutional:      General: She is not in acute distress.    Appearance: She is obese.  HENT:     Head: Normocephalic and atraumatic.     Right Ear: Tympanic membrane normal.     Left Ear: Tympanic membrane normal.  Feet:     Comments: Tip of the left second toe with callus and mild tenderness to palpation.  Surrounding erythema. Neurological:     Mental Status: She is alert.  Psychiatric:        Mood and Affect: Mood normal.        Behavior: Behavior normal.     Lab Results  Component  Value Date   WBC 6.9 03/09/2022   HGB 14.0 03/09/2022   HCT 41.7 03/09/2022   PLT 333 03/09/2022   GLUCOSE 299 (H) 03/09/2022   CHOL 249 (H) 03/09/2022   TRIG 233 (H) 03/09/2022   HDL 42 03/09/2022   LDLCALC 164 (H) 03/09/2022   ALT 24 03/09/2022   AST 12 03/09/2022   NA 136 03/09/2022   K 4.4 03/09/2022   CL 99 03/09/2022   CREATININE 0.62 03/09/2022   BUN 10 03/09/2022   CO2 24 03/09/2022   TSH 1.340 03/09/2022   HGBA1C 12.1 (H) 03/09/2022     Assessment & Plan:   Problem List Items Addressed This Visit       Endocrine   Type 2 diabetes mellitus with complication, with long-term current use of insulin ([facility])    Printed prescription given for Lantus.  I advised her to use GoodRx.  This should be available locally without any issue.      Relevant Medications   insulin glargine (LANTUS) 100 UNIT/ML Solostar Pen     Nervous and Auditory   Eustachian tube dysfunction    TMs normal.  Tylenol as needed.  Supportive care.        Other   Toe  infection    X-ray for further evaluation to ensure no underlying osteomyelitis.  Placing on Keflex.      Relevant Medications   cephALEXin (KEFLEX) 500 MG capsule   Other Relevant Orders   DG Toe 2nd Left    Meds ordered this encounter  Medications   insulin glargine (LANTUS) 100 UNIT/ML Solostar Pen    Sig: Inject 15 Units into the skin daily.    Dispense:  15 mL    Refill:  1   cephALEXin (KEFLEX) 500 MG capsule    Sig: Take 1 capsule (500 mg total) by mouth 4 (four) times daily for 7 days.    Dispense:  28 capsule    Refill:  0    Follow-up: Pending x-ray  [facility]

## 2022-03-27 NOTE — Assessment & Plan Note (Signed)
X-ray for further evaluation to ensure no underlying osteomyelitis.  Placing on Keflex.

## 2022-03-27 NOTE — Assessment & Plan Note (Signed)
TMs normal.  Tylenol as needed.  Supportive care.

## 2022-03-30 ENCOUNTER — Ambulatory Visit (HOSPITAL_COMMUNITY)
Admission: RE | Admit: 2022-03-30 | Discharge: 2022-03-30 | Disposition: A | Discharge status: Home / Self Care | Payer: 59 | Source: Ambulatory Visit | Attending: Family Medicine | Admitting: Family Medicine

## 2022-03-30 DIAGNOSIS — L089 Local infection of the skin and subcutaneous tissue, unspecified: Secondary | ICD-10-CM | POA: Insufficient documentation

## 2022-04-10 ENCOUNTER — Ambulatory Visit (INDEPENDENT_AMBULATORY_CARE_PROVIDER_SITE_OTHER): Payer: 59 | Admitting: Neurology

## 2022-04-10 ENCOUNTER — Encounter: Payer: Self-pay | Admitting: Neurology

## 2022-04-10 VITALS — BP 124/88 | HR 82 | Ht 71.0 in | Wt 286.6 lb

## 2022-04-10 DIAGNOSIS — R519 Headache, unspecified: Secondary | ICD-10-CM

## 2022-04-10 DIAGNOSIS — R251 Tremor, unspecified: Secondary | ICD-10-CM | POA: Diagnosis not present

## 2022-04-10 DIAGNOSIS — G4750 Parasomnia, unspecified: Secondary | ICD-10-CM

## 2022-04-10 DIAGNOSIS — G5603 Carpal tunnel syndrome, bilateral upper limbs: Secondary | ICD-10-CM
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
## 2022-04-10 NOTE — Patient Instructions (Signed)
Good to meet you.  Schedule open MRI brain with and without contrast  2. Schedule 1-hour EEG. If normal, we will do a 3-day home EEG  3. Schedule in-lab sleep study  4. Schedule EMG/NCV of both upper extremities  5. Discuss joint pains with Dr. Lacinda Axon  6. Follow-up after tests, call for any changes

## 2022-04-12 ENCOUNTER — Other Ambulatory Visit: Payer: Self-pay

## 2022-04-12 ENCOUNTER — Emergency Department (HOSPITAL_COMMUNITY)
Admission: EM | Admit: 2022-04-12 | Discharge: 2022-04-12 | Disposition: A | Discharge status: Home / Self Care | Payer: 59 | Attending: Emergency Medicine | Admitting: Emergency Medicine

## 2022-04-12 ENCOUNTER — Telehealth (HOSPITAL_COMMUNITY): Payer: Self-pay | Admitting: Medical

## 2022-04-12 ENCOUNTER — Encounter (HOSPITAL_COMMUNITY): Payer: Self-pay

## 2022-04-12 DIAGNOSIS — Z794 Long term (current) use of insulin: Secondary | ICD-10-CM | POA: Insufficient documentation

## 2022-04-12 DIAGNOSIS — K921 Melena: Secondary | ICD-10-CM

## 2022-04-12 DIAGNOSIS — R197 Diarrhea, unspecified: Secondary | ICD-10-CM | POA: Diagnosis present

## 2022-04-12 LAB — URINALYSIS, ROUTINE W REFLEX MICROSCOPIC
Bilirubin Urine: NEGATIVE
Glucose, UA: >=500 mg/dL — AB
Hgb urine dipstick: NEGATIVE
Ketones, ur: NEGATIVE mg/dL
Leukocytes,Ua: NEGATIVE
Nitrite: NEGATIVE
Protein, ur: NEGATIVE mg/dL
Specific Gravity, Urine: 1.003 — ABNORMAL LOW (ref 1.005–1.030)
pH: 6 (ref 5.0–8.0)

## 2022-04-12 LAB — CBC
HCT: 42.2 % (ref 36.0–46.0)
Hemoglobin: 14.2 g/dL (ref 12.0–15.0)
MCH: 29.1 pg (ref 26.0–34.0)
MCHC: 33.6 g/dL (ref 30.0–36.0)
MCV: 86.5 fL (ref 80.0–100.0)
Platelets: 296 10*3/uL (ref 150–400)
RBC: 4.88 MIL/uL (ref 3.87–5.11)
RDW: 11.8 % (ref 11.5–15.5)
WBC: 6.3 10*3/uL (ref 4.0–10.5)
nRBC: 0 % (ref 0.0–0.2)

## 2022-04-12 LAB — COMPREHENSIVE METABOLIC PANEL
ALT: 30 U/L (ref 0–44)
AST: 16 U/L (ref 15–41)
Albumin: 4.1 g/dL (ref 3.5–5.0)
Alkaline Phosphatase: 78 U/L (ref 38–126)
Anion gap: 9 (ref 5–15)
BUN: 14 mg/dL (ref 6–20)
CO2: 23 mmol/L (ref 22–32)
Calcium: 8.9 mg/dL (ref 8.9–10.3)
Chloride: 99 mmol/L (ref 98–111)
Creatinine, Ser: 0.61 mg/dL (ref 0.44–1.00)
GFR, Estimated: >60 mL/min (ref >60)
Glucose, Bld: 272 mg/dL — ABNORMAL HIGH (ref 70–99)
Potassium: 3.9 mmol/L (ref 3.5–5.1)
Sodium: 131 mmol/L — ABNORMAL LOW (ref 135–145)
Total Bilirubin: 0.4 mg/dL (ref 0.3–1.2)
Total Protein: 7.6 g/dL (ref 6.5–8.1)

## 2022-04-12 LAB — POC URINE PREG, ED: Preg Test, Ur: NEGATIVE

## 2022-04-12 LAB — LIPASE, BLOOD: Lipase: 35 U/L (ref 11–51)

## 2022-04-12 MED ORDER — CEPHALEXIN 500 MG PO CAPS: 500.0000 mg | ORAL_CAPSULE | Freq: Four times a day (QID) | ORAL | 0 refills | Status: DC

## 2022-04-12 NOTE — ED Provider Notes (Signed)
[facility] Provider Note   CSN: [id number] Arrival date & time: 04/12/22  1219     History  Chief Complaint  Patient presents with   Diarrhea    Debra Mullins is a 42 y.o. female, who presents to the ED secondary to intermittent diarrhea and constipation for the last several years, but states that she is prima here because she had a black stool this a.m.  She called her primary care doctor, and was told to come to the ER.  She states that she has struggled with intermittent constipation and diarrhea from some time, and typically takes Imodium, but last night she decided to take Pepto missed this female, and that she woke up and had black stool.  She denies any history of black stools, shortness of breath, chest pain, lightheadedness or current abdominal pain.  She states that when she does have a bowel movement, she typically has relief of any abdominal pain that she has.  Denies any right red blood in stool.    Home Medications Prior to Admission medications   Medication Sig Start Date End Date Taking? Authorizing Provider  acetaminophen (TYLENOL) 500 MG tablet Take 500 mg by mouth every 6 (six) hours as needed for mild pain or headache.    [provider]  atorvastatin (LIPITOR) 40 MG tablet Take 1 tablet (40 mg total) by mouth daily. 03/22/22   Coral Spikes, DO  insulin glargine (LANTUS) 100 UNIT/ML Solostar Pen Inject 15 Units into the skin daily. 03/27/22 10/13/22  Coral Spikes, DO  Insulin Pen Needle 31G X 5 MM MISC Use daily to administer insulin. 03/13/22   Coral Spikes, DO  pantoprazole (PROTONIX) 40 MG tablet Take 1 tablet (40 mg total) by mouth daily. 03/22/22   Coral Spikes, DO      Allergies    Sulfa antibiotics, Zithromax [azithromycin], and Prednisone    Review of Systems   Review of Systems  Constitutional:  Negative for fatigue.  Gastrointestinal:  Positive for constipation and diarrhea. Negative for anal bleeding, nausea  and vomiting.    Physical Exam Updated Vital Signs BP (!) 150/83 (BP Location: Right Arm)   Pulse 73   Temp 98 F (36.7 C) (Oral)   Resp 18   Ht 5\' 11"  (1.803 m)   Wt 129.3 kg   SpO2 98%   BMI 39.75 kg/m  Physical Exam Vitals and nursing note reviewed.  Constitutional:      General: She is not in acute distress.    Appearance: She is well-developed.  HENT:     Head: Normocephalic and atraumatic.  Eyes:     Conjunctiva/sclera: Conjunctivae normal.  Cardiovascular:     Rate and Rhythm: Normal rate and regular rhythm.     Heart sounds: No murmur heard. Pulmonary:     Effort: Pulmonary effort is normal. No respiratory distress.     Breath sounds: Normal breath sounds.  Abdominal:     Palpations: Abdomen is soft.     Tenderness: There is no abdominal tenderness.  Musculoskeletal:        General: No swelling.     Cervical back: Neck supple.  Skin:    General: Skin is warm and dry.     Capillary Refill: Capillary refill takes less than 2 seconds.  Neurological:     Mental Status: She is alert.  Psychiatric:        Mood and Affect: Mood normal.  ED Results / Procedures / Treatments   Labs (all labs ordered are listed, but only abnormal results are displayed) Labs Reviewed  COMPREHENSIVE METABOLIC PANEL - Abnormal; Notable for the following components:      Result Value   Sodium 131 (*)    Glucose, Bld 272 (*)    All other components within normal limits  LIPASE, BLOOD  CBC  URINALYSIS, ROUTINE W REFLEX MICROSCOPIC  POC URINE PREG, ED    EKG None  Radiology No results found.  Procedures Procedures    Medications Ordered in ED Medications - No data to display  ED Course/ Medical Decision Making/ A&P                             Medical Decision Making Patient is a 42 year old female, here for intermittent diarrhea constipation, with 1 episode of black stool this morning after taking Pepto-Bismol.  She denies any history of blood in stool, weight  loss, fevers, chills, abdominal pain at this time.  She has no abdominal pain on exam, and is well-appearing.  We discussed possible rectal, she declined.  We will obtain CBC, CMP, urinalysis for further evaluation.  Amount and/or Complexity of Data Reviewed Labs: ordered.    Details: Hemoglobin stable Discussion of management or test interpretation with external provider(s): Discussed with patient hemoglobin stable, no blood in the urine, black stools likely secondary to Pepto-Bismol, we discussed watching out for possible GI bleed, and to monitor and see if there is any further black stools.  Follow-up with PCP as needed.  If she develops shortness of breath, severe chest pain, or multiple black stools after not taking Pepto-Bismol, should patient return to the ER.  I am also suspicious she may have IBS, and I discussed that she should follow-up with her PCP regarding this.    Final Clinical Impression(s) / ED Diagnoses Final diagnoses:  Black stool    Rx / DC Orders ED Discharge Orders     None         Omarie Parcell, Si Gaul, PA 04/12/22 1702    Davonna Belling, MD 04/12/22 2304

## 2022-04-12 NOTE — ED Provider Triage Note (Signed)
Emergency Medicine Provider Triage Evaluation Note  Debra Mullins , a 42 y.o. female  was evaluated in triage.  Pt complains of intermittent diarrhea and constipation for several years for which she took pepto bismo for last night and had a black stool this AM. Called her PCP and was told to come to ED. Denies any hx of black stools, SOB, chest pain, lightheadedness, or current abdominal pain. Relief with Bms.   Review of Systems  Positive: Black stool Negative: SOB  Physical Exam  BP (!) 150/83 (BP Location: Right Arm)   Pulse 75   Temp 97.9 F (36.6 C) (Oral)   Resp 16   Ht 5\' 11"  (1.803 m)   Wt 129.3 kg   SpO2 97%   BMI 39.75 kg/m  Gen:   Awake, no distress   Resp:  Normal effort  MSK:   Moves extremities without difficulty   Medical Decision Making  Medically screening exam initiated at 1:59 PM.  Appropriate orders placed.  Debra Mullins was informed that the remainder of the evaluation will be completed by another provider, this initial triage assessment does not replace that evaluation, and the importance of remaining in the ED until their evaluation is complete.    Osvaldo Shipper, [place] 04/12/22 1401

## 2022-04-12 NOTE — Telephone Encounter (Cosign Needed)
Patient left prior to UA being completed, found to have many bacteria, Keflex sent to pharmacy. Patient notified of need to pick up script and UTI.

## 2022-04-12 NOTE — Discharge Instructions (Addendum)
I suspect that your black stool is likely secondary to your Pepto-Bismol use, this can cause black stools.  Make sure you are eating lots of fruits and vegetables, and drinking lots of water.  Hold off on using Pepto-Bismol, and make sure you are not having continuous black stools, if you become lightheaded, dizzy please return to the ER.

## 2022-04-12 NOTE — ED Triage Notes (Signed)
Pt reports diarrhea and left side abdominal pain intermittently for years. Pt reports she took pepto bismol last night and it improved but then she had an episode of black diarrhea today so her PCP told her to come to the ER.

## 2022-04-13 ENCOUNTER — Telehealth: Payer: Self-pay

## 2022-04-13 NOTE — Telephone Encounter (Signed)
Transition Care Management Unsuccessful Follow-up Telephone Call  Date of discharge and from where:  [facility] 04/12/2022  Attempts:  1st Attempt  Reason for unsuccessful TCM follow-up call:  Left voice message Juanda Crumble, [place] Direct Dial [phone]

## 2022-04-16 NOTE — Telephone Encounter (Signed)
Transition Care Management Unsuccessful Follow-up Telephone Call  Date of discharge and from where:  [facility] 04/12/2022  Attempts:  2nd Attempt  Reason for unsuccessful TCM follow-up call:  Left voice message Juanda Crumble, [place] Direct Dial [phone]

## 2022-04-27 ENCOUNTER — Encounter: Payer: Self-pay | Admitting: "Endocrinology

## 2022-04-27 ENCOUNTER — Other Ambulatory Visit: Payer: Self-pay

## 2022-04-27 ENCOUNTER — Ambulatory Visit (INDEPENDENT_AMBULATORY_CARE_PROVIDER_SITE_OTHER): Payer: Managed Care, Other (non HMO) | Admitting: "Endocrinology

## 2022-04-27 VITALS — BP 136/82 | HR 68 | Ht 71.0 in | Wt 283.0 lb

## 2022-04-27 DIAGNOSIS — E1159 Type 2 diabetes mellitus with other circulatory complications: Secondary | ICD-10-CM

## 2022-04-27 DIAGNOSIS — E782 Mixed hyperlipidemia: Secondary | ICD-10-CM

## 2022-04-27 DIAGNOSIS — Z6839 Body mass index (BMI) 39.0-39.9, adult: Secondary | ICD-10-CM

## 2022-04-27 DIAGNOSIS — E66812 Obesity, class 2: Secondary | ICD-10-CM | POA: Insufficient documentation

## 2022-04-27 MED ORDER — LANCET DEVICE MISC: 1.0000 | Freq: Four times a day (QID) | 0 refills | Status: AC

## 2022-04-27 MED ORDER — LANCETS MISC. MISC: 1.0000 | Freq: Four times a day (QID) | 2 refills | Status: AC

## 2022-04-27 MED ORDER — BLOOD GLUCOSE TEST VI STRP: 1.0000 | ORAL_STRIP | Freq: Four times a day (QID) | 2 refills | Status: AC

## 2022-04-27 MED ORDER — INSULIN GLARGINE 100 UNIT/ML SOLOSTAR PEN: 30.0000 [IU] | PEN_INJECTOR | Freq: Every day | SUBCUTANEOUS | 2 refills | Status: DC

## 2022-04-27 MED ORDER — BLOOD GLUCOSE MONITORING SUPPL DEVI: 1.0000 | Freq: Four times a day (QID) | 0 refills | Status: DC

## 2022-04-27 MED ORDER — OZEMPIC (0.25 OR 0.5 MG/DOSE) 2 MG/3ML ~~LOC~~ SOPN: 0.2500 mg | PEN_INJECTOR | SUBCUTANEOUS | 2 refills | Status: DC

## 2022-04-27 NOTE — Patient Instructions (Signed)
                                     Advice for Weight Management  -For most of us the best way to lose weight is by diet management. Generally speaking, diet management means consuming less calories intentionally which over time brings about progressive weight loss.  This can be achieved more effectively by avoiding ultra processed carbohydrates, processed meats, unhealthy fats.    It is critically important to know your numbers: how much calorie you are consuming and how much calorie you need. More importantly, our carbohydrates sources should be unprocessed naturally occurring  complex starch food items.  It is always important to balance nutrition also by  appropriate intake of proteins (mainly plant-based), healthy fats/oils, plenty of fruits and vegetables.   -The American College of Lifestyle Medicine (ACL M) recommends nutrition derived mostly from Whole Food, Plant Predominant Sources example an apple instead of applesauce or apple pie. Eat Plenty of vegetables, Mushrooms, fruits, Legumes, Whole Grains, Nuts, seeds in lieu of processed meats, processed snacks/pastries red meat, poultry, eggs.  Use only water or unsweetened tea for hydration.  The College also recommends the need to stay away from risky substances including alcohol, smoking; obtaining 7-9 hours of restorative sleep, at least 150 minutes of moderate intensity exercise weekly, importance of healthy social connections, and being mindful of stress and seek help when it is overwhelming.    -Sticking to a routine mealtime to eat 3 meals a day and avoiding unnecessary snacks is shown to have a big role in weight control. Under normal circumstances, the only time we burn stored energy is when we are hungry, so allow  some hunger to take place- hunger means no food between appropriate meal times, only water.  It is not advisable to starve.   -It is better to avoid simple carbohydrates including:  Cakes, Sweet Desserts, Ice Cream, Soda (diet and regular), Sweet Tea, Candies, Chips, Cookies, Store Bought Juices, Alcohol in Excess of  1-2 drinks a day, Lemonade,  Artificial Sweeteners, Doughnuts, Coffee Creamers, "Sugar-free" Products, etc, etc.  This is not a complete list.....    -Consulting with certified diabetes educators is proven to provide you with the most accurate and current information on diet.  Also, you may be  interested in discussing diet options/exchanges , we can schedule a visit with Debra Mullins, RDN, CDE for individualized nutrition education.  -Exercise: If you are able: 30 -60 minutes a day ,4 days a week, or 150 minutes of moderate intensity exercise weekly.    The longer the better if tolerated.  Combine stretch, strength, and aerobic activities.  If you were told in the past that you have high risk for cardiovascular diseases, or if you are currently symptomatic, you may seek evaluation by your heart doctor prior to initiating moderate to intense exercise programs.                                  Additional Care Considerations for Diabetes/Prediabetes   -Diabetes  is a chronic disease.  The most important care consideration is regular follow-up with your diabetes care provider with the goal being avoiding or delaying its complications and to take advantage of advances in medications and technology.  If appropriate actions are taken early enough, type 2 diabetes can even be   reversed.  Seek information from the right source.  - Whole Food, Plant Predominant Nutrition is highly recommended: Eat Plenty of vegetables, Mushrooms, fruits, Legumes, Whole Grains, Nuts, seeds in lieu of processed meats, processed snacks/pastries red meat, poultry, eggs as recommended by American College of  Lifestyle Medicine (ACLM).  -Type 2 diabetes is known to coexist with other important comorbidities such as high blood pressure and high cholesterol.  It is critical to control not only the  diabetes but also the high blood pressure and high cholesterol to minimize and delay the risk of complications including coronary artery disease, stroke, amputations, blindness, etc.  The good news is that this diet recommendation for type 2 diabetes is also very helpful for managing high cholesterol and high blood blood pressure.  - Studies showed that people with diabetes will benefit from a class of medications known as ACE inhibitors and statins.  Unless there are specific reasons not to be on these medications, the standard of care is to consider getting one from these groups of medications at an optimal doses.  These medications are generally considered safe and proven to help protect the heart and the kidneys.    - People with diabetes are encouraged to initiate and maintain regular follow-up with eye doctors, foot doctors, dentists , and if necessary heart and kidney doctors.     - It is highly recommended that people with diabetes quit smoking or stay away from smoking, and get yearly  flu vaccine and pneumonia vaccine at least every 5 years.  See above for additional recommendations on exercise, sleep, stress management , and healthy social connections.      

## 2022-04-27 NOTE — Progress Notes (Signed)
Endocrinology Consult Note       04/27/2022, 1:26 PM   Subjective:    Patient ID: Debra Mullins, female    DOB: [birth year].  Debra Mullins is being seen in consultation for management of currently uncontrolled symptomatic diabetes requested by  Coral Spikes, DO.   Past Medical History:  Diagnosis Date   Diabetes ([place])    Heart attack ([place]) 2015   Prinzmetal angina ([place])    Echo 2/22: EF 55-60, no RWMA, normal RVSF, no MR    Past Surgical History:  Procedure Laterality Date   HYSTEROTOMY     OOPHORECTOMY      Social History   Socioeconomic History   Marital status: Married    Spouse name: Not on file   Number of children: Not on file   Years of education: Not on file   Highest education level: Not on file  Occupational History   Not on file  Tobacco Use   Smoking status: Never   Smokeless tobacco: Never  Vaping Use   Vaping Use: Never used  Substance and Sexual Activity   Alcohol use: Never   Drug use: Never   Sexual activity: Not on file  Other Topics Concern   Not on file  Social History Narrative   Are you right handed or left handed? Right handed    Are you currently employed ? 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 No with family    What type of home do you live in: 1 story or 2 story?  1 story        Social Determinants of Radio broadcast assistant Strain: Not on file  Food Insecurity: Not on file  Transportation Needs: Not on file  Physical Activity: Not on file  Stress: Not on file  Social Connections: Not on file    Family History  Problem Relation Age of Onset   CAD Mother    Heart attack Paternal Aunt     Outpatient Encounter Medications as of 04/27/2022  Medication Sig   Semaglutide,0.25 or 0.5MG/DOS, (OZEMPIC, 0.25 OR 0.5 MG/DOSE,) 2 MG/3ML SOPN Inject 0.25 mg into the skin once a week.   acetaminophen (TYLENOL) 500  MG tablet Take 500 mg by mouth every 6 (six) hours as needed for mild pain or headache.   atorvastatin (LIPITOR) 40 MG tablet Take 1 tablet (40 mg total) by mouth daily. (Patient not taking: Reported on 04/27/2022)   Insulin Pen Needle 31G X 5 MM MISC Use daily to administer insulin.   pantoprazole (PROTONIX) 40 MG tablet Take 1 tablet (40 mg total) by mouth daily.   [DISCONTINUED] cephALEXin (KEFLEX) 500 MG capsule Take 1 capsule (500 mg total) by mouth 4 (four) times daily. (Patient not taking: Reported on 04/27/2022)   [DISCONTINUED] insulin glargine (LANTUS) 100 UNIT/ML Solostar Pen Inject 15 Units into the skin daily.   [DISCONTINUED] insulin glargine (LANTUS) 100 UNIT/ML Solostar Pen Inject 30 Units into the skin at bedtime.   No facility-administered encounter medications on file as of 04/27/2022.    ALLERGIES: Allergies  Allergen Reactions   Sulfa Antibiotics Anaphylaxis   Metformin And Related Diarrhea   Zinc    Zithromax [Azithromycin] Diarrhea   Prednisone Palpitations    VACCINATION STATUS:  There is no immunization history on file for this patient.  Diabetes She presents for her initial diabetic visit. She has type 2 diabetes mellitus. Onset time: She was diagnosed at approximate age of 42 years. Her disease course has been worsening. There are no hypoglycemic associated symptoms. Pertinent negatives for hypoglycemia include no confusion, headaches, pallor or seizures. Associated symptoms include fatigue, polydipsia and polyuria. Pertinent negatives for diabetes include no chest pain and no polyphagia. There are no hypoglycemic complications. Symptoms are worsening. Diabetic complications include heart disease. Risk factors for coronary artery disease include diabetes mellitus, dyslipidemia, obesity and sedentary lifestyle. Current diabetic treatments: She is on Lantus 15 units every morning. Her weight is fluctuating minimally. She is following a generally unhealthy diet. When  asked about meal planning, she reported none. She has not had a previous visit with a dietitian. She participates in exercise intermittently. (She did not bring any logs nor meter with her.  Her recent A1c was 12.1%.) An ACE inhibitor/angiotensin II receptor blocker is not being taken.  Hyperlipidemia This is a chronic problem. The problem is uncontrolled. Exacerbating diseases include diabetes and obesity. Pertinent negatives include no chest pain, myalgias or shortness of breath. Current antihyperlipidemic treatment includes statins. Risk factors for coronary artery disease include dyslipidemia, diabetes mellitus, obesity, a sedentary lifestyle and family history.     Review of Systems  Constitutional:  Positive for fatigue. Negative for chills, fever and unexpected weight change.  HENT:  Negative for trouble swallowing and voice change.   Eyes:  Negative for visual disturbance.  Respiratory:  Negative for cough, shortness of breath and wheezing.   Cardiovascular:  Negative for chest pain, palpitations and leg swelling.  Gastrointestinal:  Negative for diarrhea, nausea and vomiting.  Endocrine: Positive for polydipsia and polyuria. Negative for cold intolerance, heat intolerance and polyphagia.  Musculoskeletal:  Negative for arthralgias and myalgias.  Skin:  Negative for color change, pallor, rash and wound.  Neurological:  Negative for seizures and headaches.  Psychiatric/Behavioral:  Negative for confusion and suicidal ideas.     Objective:       04/27/2022    9:32 AM 04/12/2022    3:54 PM 04/12/2022   12:37 PM  Vitals with BMI  Height 5' 11"$   5' 11"$   Weight 283 lbs  285 lbs  BMI [id number]  0000000  Systolic [id number]   Diastolic 82 83   Pulse 68 73     BP 136/82   Pulse 68   Ht 5' 11"$  (1.803 m)   Wt 283 lb (128.4 kg)   BMI 39.47 kg/m   Wt Readings from Last 3 Encounters:  04/27/22 283 lb (128.4 kg)  04/12/22 285 lb (129.3 kg)  04/10/22 286 lb 9.6 oz (130 kg)     Physical  Exam Constitutional:      Appearance: She is well-developed.  HENT:     Head: Normocephalic and atraumatic.  Neck:     Thyroid: No thyromegaly.     Trachea: No tracheal deviation.  Cardiovascular:     Rate and Rhythm: Normal rate and regular rhythm.  Pulmonary:     Effort: Pulmonary effort is normal.     Breath sounds: Normal breath sounds.  Abdominal:     General: Bowel sounds are normal.     Palpations: Abdomen is soft.  Tenderness: There is no abdominal tenderness. There is no guarding.  Musculoskeletal:        General: Normal range of motion.     Cervical back: Normal range of motion and neck supple.  Skin:    General: Skin is warm and dry.     Coloration: Skin is not pale.     Findings: No erythema or rash.  Neurological:     Mental Status: She is alert and oriented to person, place, and time.     Cranial Nerves: No cranial nerve deficit.     Coordination: Coordination normal.     Deep Tendon Reflexes: Reflexes are normal and symmetric.  Psychiatric:        Judgment: Judgment normal.       CMP ( most recent) CMP     Component Value Date/Time   NA 131 (L) 04/12/2022 1254   NA 136 03/09/2022 1624   K 3.9 04/12/2022 1254   CL 99 04/12/2022 1254   CO2 23 04/12/2022 1254   GLUCOSE 272 (H) 04/12/2022 1254   BUN 14 04/12/2022 1254   BUN 10 03/09/2022 1624   CREATININE 0.61 04/12/2022 1254   CALCIUM 8.9 04/12/2022 1254   PROT 7.6 04/12/2022 1254   PROT 7.4 03/09/2022 1624   ALBUMIN 4.1 04/12/2022 1254   ALBUMIN 4.5 03/09/2022 1624   AST 16 04/12/2022 1254   ALT 30 04/12/2022 1254   ALKPHOS 78 04/12/2022 1254   BILITOT 0.4 04/12/2022 1254   BILITOT 0.3 03/09/2022 1624   GFRNONAA >60 04/12/2022 1254   GFRAA >60 07/21/2018 0054     Diabetic Labs (most recent): Lab Results  Component Value Date   HGBA1C 12.1 (H) 03/09/2022     Lipid Panel ( most recent) Lipid Panel     Component Value Date/Time   CHOL 249 (H) 03/09/2022 1624   TRIG 233 (H)  03/09/2022 1624   HDL 42 03/09/2022 1624   CHOLHDL 5.9 (H) 03/09/2022 1624   LDLCALC 164 (H) 03/09/2022 1624   LABVLDL 43 (H) 03/09/2022 1624      Lab Results  Component Value Date   TSH 1.340 03/09/2022      Assessment & Plan:   1. DM type 2 causing vascular disease ([place])   - Cherylynn Orrico has currently uncontrolled symptomatic type 2 DM since  42 years of age,  with most recent A1c of 12.1 %. Recent labs reviewed. - I had a long discussion with her about the possible risk factors and  the pathology behind its diabetes and its complications. -her diabetes is complicated by  coronary artery disease, obesity/sedentary life and she remains at a high risk for more acute and chronic complications which include CAD, CVA, CKD, retinopathy, and neuropathy. These are all discussed in detail with her.  - I discussed all available options of managing her diabetes including de-escalation of medications. I have counseled her on Food as Medicine by adopting a Whole Food , Plant Predominant  ( WFPP) nutrition as recommended by [employer] of Lifestyle Medicine. Patient is encouraged to switch to  unprocessed or minimally processed  complex starch, adequate protein intake (mainly plant source), minimal liquid fat, plenty of fruits, and vegetables. -  she is advised to stick to a routine mealtimes to eat 3 complete meals a day and snack only when necessary ( to snack only to correct hypoglycemia BG <70 day time or <100 at night).   - she acknowledges that there is a room for improvement in her  food and drink choices. - Further Specific Suggestion is made for her to avoid simple carbohydrates  from her diet including Cakes, Sweet Desserts, Ice Cream, Soda (diet and regular), Sweet Tea, Candies, Chips, Cookies, Store Bought Juices, Alcohol ,  Artificial Sweeteners,  Coffee Creamer, and "Sugar-free" Products. This will help patient to have more stable blood glucose profile and potentially avoid unintended  weight gain.  The following Lifestyle Medicine recommendations according to [facility]) were discussed and offered to patient and she agrees to start the journey:  A.  Whole Foods, Plant-based plate comprising of fruits and vegetables, plant-based proteins, whole-grain carbohydrates was discussed in detail with the patient.   A list for source of those nutrients were also provided to the patient.  Patient will use only water or unsweetened tea for hydration. B.  The need to stay away from risky substances including alcohol, smoking; obtaining 7 to 9 hours of restorative sleep, at least 150 minutes of moderate intensity exercise weekly, the importance of healthy social connections,  and stress reduction techniques were discussed. C.  A full color page of  Calorie density of various food groups per pound showing examples of each food groups was provided to the patient.  - she will be scheduled with Jearld Fenton, RDN, CDE for individualized diabetes education.  - I have approached her with the following individualized plan to manage  her diabetes and patient agrees:   - she will likely need multiple daily injections of insulin in order for her to achieve control of diabetes to target.  In preparation, she is advised to increase her Lantus to 30 units nightly, start monitoring blood glucose 4 times a day-before meals and at bedtime and return in 1 week with her meter and logs for reevaluation.   -This patient will benefit from a CGM, will be considered for freestyle libre or Dexcom next visit. If she presents with significant postprandial hyperglycemia, she will be considered for prandial insulin as well. She reports intolerance to metformin, and other GLP-1 receptor agonist.  However, she would like to try Ozempic again.  I discussed and initiated Ozempic 0.25 mg subcutaneously weekly to advance if tolerated.   - she is encouraged to call clinic for blood glucose  levels less than 70 or above 200 mg /dl.  - Specific targets for  A1c;  LDL, HDL,  and Triglycerides were discussed with the patient.  2) Blood Pressure /Hypertension:  her blood pressure is  controlled to target.  She is not currently on antihypertensive medications.   3) Lipids/Hyperlipidemia:   Review of her recent lipid panel showed uncontrolled  LDL at 164 .  she  is advised to continue    atorvastatin 40 mg daily at bedtime.  Side effects and precautions discussed with her.  4)  Weight/Diet:  Body mass index is 39.47 kg/m.  -   clearly complicating her diabetes care.   she is  a candidate for weight loss. I discussed with her the fact that loss of 5 - 10% of her  current body weight will have the most impact on her diabetes management.  The above detailed  ACLM recommendations for nutrition, exercise, sleep, social life, avoidance of risky substances, the need for restorative sleep   information will also detailed on discharge instructions.  5) Chronic Care/Health Maintenance:  -she  is on Statin medications and  is encouraged to initiate and continue to follow up with Ophthalmology, Dentist,  Podiatrist at least yearly  or according to recommendations, and advised to   stay away from smoking. I have recommended yearly flu vaccine and pneumonia vaccine at least every 5 years; moderate intensity exercise for up to 150 minutes weekly; and  sleep for 7- 9 hours a day.  - she is  advised to maintain close follow up with Coral Spikes, DO for primary care needs, as well as her other providers for optimal and coordinated care.   I spent 63 minutes in the care of the patient today including review of labs from [place], Lipids, Thyroid Function, Hematology (current and previous including abstractions from other facilities); face-to-face time discussing  her blood glucose readings/logs, discussing hypoglycemia and hyperglycemia episodes and symptoms, medications doses, her options of short and long term  treatment based on the latest standards of care / guidelines;  discussion about incorporating lifestyle medicine;  and documenting the encounter. Risk reduction counseling performed per USPSTF guidelines to reduce  obesity and cardiovascular risk factors.      Please refer to Patient Instructions for Blood Glucose Monitoring and Insulin/Medications Dosing Guide"  in media tab for additional information. Please  also refer to " Patient Self Inventory" in the Media  tab for reviewed elements of pertinent patient history.  Jackquelyn Bickert participated in the discussions, expressed understanding, and voiced agreement with the above plans.  All questions were answered to her satisfaction. she is encouraged to contact clinic should she have any questions or concerns prior to her return visit.   Follow up plan: - Return in about 1 week (around 05/04/2022) for F/U with Meter/CGM Edison Simon Only - no Labs, [place] with Levenia Skalicky.  Glade Lloyd, MD [facility] [address] [place], [place] [address] Phone: [phone]  Fax: [phone]    04/27/2022, 1:26 PM  This note was partially dictated with voice recognition software. Similar sounding words can be transcribed inadequately or may not  be corrected upon review.

## 2022-05-03 ENCOUNTER — Telehealth: Payer: Self-pay

## 2022-05-03 NOTE — Telephone Encounter (Signed)
Pt called no answer left a voice mail to call the office back  °

## 2022-05-03 NOTE — Telephone Encounter (Signed)
-----   Message from Cameron Sprang, MD sent at 05/03/2022  8:51 AM EST ----- Regarding: MRI results Pls let her know brain MRI looks good, normal. No tumor, stroke, or bleed. We had discussed doing several tests, including EEG, sleep study, and EMG. If she would like to proceed, pls have her schedule them, thanks  ----- Message ----- From: Rusty Aus Sent: 05/02/2022   3:57 PM EST To: Cameron Sprang, MD

## 2022-05-08 ENCOUNTER — Ambulatory Visit: Payer: 59 | Admitting: "Endocrinology

## 2022-05-10 NOTE — Telephone Encounter (Signed)
-----   Message from Karen M Aquino, MD sent at 05/03/2022  8:51 AM EST ----- Regarding: MRI results Pls let her know brain MRI looks good, normal. No tumor, stroke, or bleed. We had discussed doing several tests, including EEG, sleep study, and EMG. If she would like to proceed, pls have her schedule them, thanks  ----- Message ----- From: White, Kristina W Sent: 05/02/2022   3:57 PM EST To: Karen M Aquino, MD   

## 2022-05-10 NOTE — Telephone Encounter (Signed)
Pt called again no answer left a voice mail will send a mychart message

## 2022-05-30 ENCOUNTER — Encounter: Payer: 59 | Admitting: Pulmonary Disease

## 2022-06-12 ENCOUNTER — Ambulatory Visit: Payer: 59 | Admitting: "Endocrinology

## 2022-06-26 ENCOUNTER — Ambulatory Visit: Payer: 59 | Admitting: Family Medicine

## 2022-07-25 ENCOUNTER — Other Ambulatory Visit: Payer: Self-pay | Admitting: "Endocrinology

## 2022-08-15 ENCOUNTER — Other Ambulatory Visit: Payer: Self-pay | Admitting: Family Medicine

## 2022-08-22 ENCOUNTER — Encounter: Payer: Self-pay | Admitting: Nurse Practitioner

## 2022-08-22 ENCOUNTER — Ambulatory Visit (INDEPENDENT_AMBULATORY_CARE_PROVIDER_SITE_OTHER): Payer: Managed Care, Other (non HMO) | Admitting: Nurse Practitioner

## 2022-08-22 VITALS — BP 128/79 | HR 76 | Temp 97.9°F | Ht 71.0 in | Wt 285.0 lb

## 2022-08-22 DIAGNOSIS — E1143 Type 2 diabetes mellitus with diabetic autonomic (poly)neuropathy: Secondary | ICD-10-CM

## 2022-08-22 DIAGNOSIS — K219 Gastro-esophageal reflux disease without esophagitis: Secondary | ICD-10-CM | POA: Diagnosis not present

## 2022-08-22 DIAGNOSIS — E1159 Type 2 diabetes mellitus with other circulatory complications: Secondary | ICD-10-CM | POA: Diagnosis not present

## 2022-08-22 DIAGNOSIS — K3184 Gastroparesis: Secondary | ICD-10-CM

## 2022-08-22 DIAGNOSIS — K581 Irritable bowel syndrome with constipation: Secondary | ICD-10-CM

## 2022-08-22 DIAGNOSIS — Z7984 Long term (current) use of oral hypoglycemic drugs: Secondary | ICD-10-CM

## 2022-08-22 DIAGNOSIS — E1142 Type 2 diabetes mellitus with diabetic polyneuropathy: Secondary | ICD-10-CM | POA: Insufficient documentation

## 2022-08-22 DIAGNOSIS — E782 Mixed hyperlipidemia: Secondary | ICD-10-CM | POA: Diagnosis not present

## 2022-08-22 DIAGNOSIS — L089 Local infection of the skin and subcutaneous tissue, unspecified: Secondary | ICD-10-CM | POA: Diagnosis not present

## 2022-08-22 DIAGNOSIS — B369 Superficial mycosis, unspecified: Secondary | ICD-10-CM

## 2022-08-22 MED ORDER — EMPAGLIFLOZIN 25 MG PO TABS: 25.0000 mg | ORAL_TABLET | Freq: Every day | ORAL | 2 refills | Status: DC

## 2022-08-22 MED ORDER — CEPHALEXIN 500 MG PO CAPS: 500.0000 mg | ORAL_CAPSULE | Freq: Three times a day (TID) | ORAL | 0 refills | Status: DC

## 2022-08-22 MED ORDER — PRAVASTATIN SODIUM 20 MG PO TABS: 20.0000 mg | ORAL_TABLET | Freq: Every day | ORAL | 1 refills | Status: DC

## 2022-08-22 NOTE — Progress Notes (Addendum)
Subjective:    Patient ID: Debra Mullins, female    DOB: August 29, 1980, 42 y.o.   MRN: [id number]  HPI Diarrhea every 2 to 3 days for 3 months  2nd toe left foot took off corn 7 months ago Need follow for cholesterol Presents for several complaints.  Having swelling and tenderness in her left second toe that began 7 months ago.  Had gotten slowly better but recently hit the toe a couple of times.  Patient removed a corn on the end of the toe herself.  Last seen for this at our office on 03/30/2022.  X-ray normal with no signs of fracture or osteomyelitis. Takes pantoprazole as needed for GERD.  Tries not to take it every day due to concerns about long-term side effects.  Drinks coffee in the morning otherwise no caffeine use.  No alcohol or tobacco use.  Has identified specific foods as a trigger. Chronic issues with constipation, will go for up to 3 days with no bowel movement.  Will take Metamucil for about 5 days and then will have significant diarrhea.  This is her typical cycle.  Had some cramping and pain with diarrhea last night.  No fever or blood in her stools.  No bloating.  Patient had an EGD and colonoscopy in her 30s.  Also experiencing early satiety.  Discontinued Ozempic prescribed by local endocrinologist due to concerns about worsening gastroparesis. Adherent to insulin regimen.  Has had an eye exam in the past year. Has been on multiple statins and had to discontinue them all due to myalgias. Was wearing a freestyle libre 3 but has trouble with this staying on the skin.  Would like to continue using this. Also c/o a rash in the folds under the abdomen. Mildly pruritic. Comes and goes.    Review of Systems  Constitutional:  Positive for fatigue. Negative for unexpected weight change.  HENT:  Negative for sore throat and trouble swallowing.   Respiratory:  Negative for cough and chest tightness.   Cardiovascular:  Negative for chest pain and leg swelling.  Gastrointestinal:  Positive  for abdominal pain, constipation and diarrhea. Negative for abdominal distention and blood in stool.  Skin:  Positive for rash and wound.       Objective:   Physical Exam NAD.  Alert, oriented.  Thyroid nontender to palpation, no mass or goiter noted.  Lungs clear.  Heart regular rate rhythm.  Abdomen soft nondistended with active bowel sounds; distinct upper epigastric area tenderness to palpation, minimal abdominal tenderness otherwise. Confluent shiny erythematous rash noted in the skin folds under the abdomen mainly on the left side. Slightly raised erythematous papules along the border.  Diabetic Foot Exam - Simple   Simple Foot Form  08/22/2022  9:30 AM  Visual Inspection See comments: Yes Sensation Testing See comments: Yes Pulse Check See comments: Yes Comments DP pulses present bilaterally but slightly weaker left foot; Decreased sensation in all of the toes; edema of the second toe with erythema at the distal portion; tiny open area at the end of the toe; mildly tender to palpation; normal cap refill    Today's Vitals   08/22/22 0916  BP: 128/79  Pulse: 76  Temp: 97.9 F (36.6 C)  SpO2: 99%  Weight: 285 lb (129.3 kg)  Height: 5\' 11"  (1.803 m)   Body mass index is 39.75 kg/m.        Assessment & Plan:   Problem List Items Addressed This Visit  Cardiovascular and Mediastinum   DM type 2 causing vascular disease (HCC)   Relevant Medications   empagliflozin (JARDIANCE) 25 MG TABS tablet   pravastatin (PRAVACHOL) 20 MG tablet     Digestive   Diabetic gastroparesis (HCC)   Relevant Medications   empagliflozin (JARDIANCE) 25 MG TABS tablet   pravastatin (PRAVACHOL) 20 MG tablet   GERD (gastroesophageal reflux disease)   Irritable bowel syndrome with constipation     Endocrine   Diabetic peripheral neuropathy (HCC)   Relevant Medications   empagliflozin (JARDIANCE) 25 MG TABS tablet   pravastatin (PRAVACHOL) 20 MG tablet     Other   Mixed  hyperlipidemia   Relevant Medications   pravastatin (PRAVACHOL) 20 MG tablet   Toe infection - Primary   Relevant Medications   cephALEXin (KEFLEX) 500 MG capsule   Other Relevant Orders   Ambulatory referral to Podiatry   Other Visit Diagnoses     Fungal skin infection       Relevant Medications   cephALEXin (KEFLEX) 500 MG capsule      Meds ordered this encounter  Medications   empagliflozin (JARDIANCE) 25 MG TABS tablet    Sig: Take 1 tablet (25 mg total) by mouth daily before breakfast.    Dispense:  30 tablet    Refill:  2    Order Specific Question:   Supervising Provider    Answer:   Lilyan Punt A [[id number]]   pravastatin (PRAVACHOL) 20 MG tablet    Sig: Take 1 tablet (20 mg total) by mouth daily. For cholesterol    Dispense:  30 tablet    Refill:  1    Order Specific Question:   Supervising Provider    Answer:   Lilyan Punt A [[id number]]   cephALEXin (KEFLEX) 500 MG capsule    Sig: Take 1 capsule (500 mg total) by mouth 3 (three) times daily.    Dispense:  21 capsule    Refill:  0    Order Specific Question:   Supervising Provider    Answer:   Lilyan Punt A [[id number]]   ketoconazole (NIZORAL) 2 % cream    Sig: Apply 1 Application topically 2 (two) times daily. To rash on abdomen prn    Dispense:  30 g    Refill:  0    Order Specific Question:   Supervising Provider    Answer:   Lilyan Punt A [[id number]]      Start Keflex as directed.  Warning signs reviewed regarding worsening infection.  Urgent referral to podiatry for evaluation.   Take pantoprazole 40 mg daily for the next month. Start topical ketoconazole. Keep area clean and dry.  Given samples of MiraLAX to take as directed for constipation.  Adjust dosage based on response. Given a sample of freestyle libre 3 with ideas to keep the sensor from coming off.  Discussed options to help with her blood sugars.  Agrees to start Jardiance 25 mg daily. Also start pravastatin 20 mg daily.  Discontinue if severe  myalgias.  If patient is not able to tolerate medication, recommend referral to cardiology to discuss options. Return in about 1 month (around 09/21/2022). Call back sooner if needed.

## 2022-08-23 MED ORDER — KETOCONAZOLE 2 % EX CREA: 1.0000 | TOPICAL_CREAM | Freq: Two times a day (BID) | CUTANEOUS | 0 refills | Status: DC

## 2022-08-23 NOTE — Addendum Note (Signed)
Addended by: Campbell Riches on: 08/23/2022 01:36 PM   Modules accepted: Orders

## 2022-08-24 ENCOUNTER — Other Ambulatory Visit: Payer: Self-pay | Admitting: "Endocrinology

## 2022-08-28 ENCOUNTER — Telehealth: Payer: Self-pay | Admitting: *Deleted

## 2022-08-28 NOTE — Telephone Encounter (Signed)
Debra Mullins is non preferred on insurance- preferred alternative is dapagliflozin propanediol

## 2022-08-31 ENCOUNTER — Other Ambulatory Visit: Payer: Self-pay | Admitting: Nurse Practitioner

## 2022-08-31 MED ORDER — EMPAGLIFLOZIN 10 MG PO TABS: 10.0000 mg | ORAL_TABLET | Freq: Every day | ORAL | 0 refills | Status: DC

## 2022-08-31 NOTE — Telephone Encounter (Signed)
Switched to Farxiga 

## 2022-09-01 ENCOUNTER — Other Ambulatory Visit: Payer: Self-pay | Admitting: "Endocrinology

## 2022-09-14 ENCOUNTER — Other Ambulatory Visit: Payer: Self-pay | Admitting: Nurse Practitioner

## 2022-09-18 ENCOUNTER — Telehealth: Payer: Managed Care, Other (non HMO) | Admitting: Physician Assistant

## 2022-09-18 DIAGNOSIS — R110 Nausea: Secondary | ICD-10-CM

## 2022-09-18 DIAGNOSIS — J189 Pneumonia, unspecified organism: Secondary | ICD-10-CM

## 2022-09-18 DIAGNOSIS — Z20822 Contact with and (suspected) exposure to covid-19: Secondary | ICD-10-CM | POA: Diagnosis not present

## 2022-09-18 MED ORDER — DOXYCYCLINE HYCLATE 100 MG PO TABS
100.0000 mg | ORAL_TABLET | Freq: Two times a day (BID) | ORAL | 0 refills | Status: AC
Start: 2022-09-18 — End: ?

## 2022-09-18 MED ORDER — ONDANSETRON 4 MG PO TBDP
4.0000 mg | ORAL_TABLET | Freq: Three times a day (TID) | ORAL | 0 refills | Status: AC | PRN
Start: 2022-09-18 — End: ?

## 2022-09-18 MED ORDER — NIRMATRELVIR/RITONAVIR (PAXLOVID)TABLET
3.0000 | ORAL_TABLET | Freq: Two times a day (BID) | ORAL | 0 refills | Status: AC
Start: 2022-09-18 — End: 2022-09-23

## 2022-09-18 MED ORDER — ALBUTEROL SULFATE HFA 108 (90 BASE) MCG/ACT IN AERS
1.00 | INHALATION_SPRAY | Freq: Four times a day (QID) | RESPIRATORY_TRACT | 0 refills | Status: AC | PRN
Start: 2022-09-18 — End: ?

## 2022-09-18 NOTE — Progress Notes (Signed)
Virtual Visit Consent   Debra Mullins, you are scheduled for a virtual visit with a [facility] provider today. Just as with appointments in the office, your consent must be obtained to participate. Your consent will be active for this visit and any virtual visit you may have with one of our providers in the next 365 days. If you have a MyChart account, a copy of this consent can be sent to you electronically.  As this is a virtual visit, video technology does not allow for your provider to perform a traditional examination. This may limit your provider's ability to fully assess your condition. If your provider identifies any concerns that need to be evaluated in person or the need to arrange testing (such as labs, EKG, etc.), we will make arrangements to do so. Although advances in technology are sophisticated, we cannot ensure that it will always work on either your end or our end. If the connection with a video visit is poor, the visit may have to be switched to a telephone visit. With either a video or telephone visit, we are not always able to ensure that we have a secure connection.  By engaging in this virtual visit, you consent to the provision of healthcare and authorize for your insurance to be billed (if applicable) for the services provided during this visit. Depending on your insurance coverage, you may receive a charge related to this service.  I need to obtain your verbal consent now. Are you willing to proceed with your visit today? Debra Mullins has provided verbal consent on 09/18/2022 for a virtual visit (video or telephone). [name], PA-C  Date: 09/18/2022 1:05 PM  Virtual Visit via Video Note   I, [name], connected with  Debra Mullins  ([id number], [birth year]) on 09/18/22 at 12:45 PM EDT by a video-enabled telemedicine application and verified that I am speaking with the correct person using two identifiers.  Location: Patient: Virtual Visit Location Patient:  Home Provider: Virtual Visit Location Provider: Home Office   I discussed the limitations of evaluation and management by telemedicine and the availability of in person appointments. The patient expressed understanding and agreed to proceed.    History of Present Illness: Debra Mullins is a 42 y.o. who identifies as a female who was assigned female at birth, and is being seen today for Covid 77.  HPI: URI  This is a new problem. The current episode started yesterday (Tested positive today and symptoms started yesterday). The maximum temperature recorded prior to her arrival was 102 - 102.9 F. The fever has been present for Less than 1 day. Associated symptoms include chest pain (tightness), congestion, coughing (some), diarrhea, headaches (off and on), nausea, rhinorrhea (a couple days ago now improved), sneezing and wheezing. Pertinent negatives include no ear pain, plugged ear sensation, sinus pain, sore throat or vomiting. Associated symptoms comments: Palpitations (HR in 120s). Treatments tried: tylenol cold and flu, alka seltzer cold and flu. The treatment provided no relief.   Daughter and Husband both have Covid and she has been close to both, Father is in hospital with pneumonia (has been around him as well). She has tested negative for Covid 19 this morning.  Problems:  Patient Active Problem List   Diagnosis Date Noted   Diabetic peripheral neuropathy (HCC) 08/22/2022   Diabetic gastroparesis (HCC) 08/22/2022   Irritable bowel syndrome with constipation 08/22/2022   Class 2 severe obesity due to excess calories with serious comorbidity and body mass index (BMI) of  39.0 to 39.9 in adult [facility]) 04/27/2022   Toe infection 03/27/2022   Eustachian tube dysfunction 03/27/2022   Epigastric pain 03/12/2022   GERD (gastroesophageal reflux disease) 03/12/2022   Episode of shaking 03/12/2022   Mixed hyperlipidemia 02/23/2021   DM type 2 causing vascular disease (HCC) 02/23/2021   Prinzmetal  angina (HCC)     Allergies:  Allergies  Allergen Reactions   Sulfa Antibiotics Anaphylaxis   Metformin And Related Diarrhea   Zinc    Zithromax [Azithromycin] Diarrhea   Prednisone Palpitations   Medications:  Current Outpatient Medications:    acetaminophen (TYLENOL) 500 MG tablet, Take 500 mg by mouth every 6 (six) hours as needed for mild pain or headache., Disp: , Rfl:    albuterol (VENTOLIN HFA) 108 (90 Base) MCG/ACT inhaler, Inhale 1-2 puffs into the lungs every 6 (six) hours as needed., Disp: 8 g, Rfl: 0   Blood Glucose Monitoring Suppl DEVI, 1 each by Does not apply route in the morning, at noon, in the evening, and at bedtime. May substitute to any manufacturer covered by patient's insurance., Disp: 1 each, Rfl: 0   cephALEXin (KEFLEX) 500 MG capsule, Take 1 capsule (500 mg total) by mouth 3 (three) times daily., Disp: 21 capsule, Rfl: 0   doxycycline (VIBRA-TABS) 100 MG tablet, Take 1 tablet (100 mg total) by mouth 2 (two) times daily., Disp: 20 tablet, Rfl: 0   empagliflozin (JARDIANCE) 10 MG TABS tablet, Take 1 tablet (10 mg total) by mouth daily before breakfast., Disp: 90 tablet, Rfl: 0   insulin glargine-yfgn (SEMGLEE, YFGN,) 100 UNIT/ML Pen, INJECT 30 UNITS INTO THE SKIN AT BEDTIME AS DIRECTED, Disp: 45 mL, Rfl: 1   Insulin Pen Needle 31G X 5 MM MISC, Use daily to administer insulin., Disp: 100 each, Rfl: 1   ketoconazole (NIZORAL) 2 % cream, Apply 1 Application topically 2 (two) times daily. To rash on abdomen prn, Disp: 30 g, Rfl: 0   nirmatrelvir/ritonavir (PAXLOVID) 20 x 150 MG & 10 x 100MG  TABS, Take 3 tablets by mouth 2 (two) times daily for 5 days. (Take nirmatrelvir 150 mg two tablets twice daily for 5 days and ritonavir 100 mg one tablet twice daily for 5 days) Patient GFR is greater than 60, Disp: 30 tablet, Rfl: 0   ondansetron (ZOFRAN-ODT) 4 MG disintegrating tablet, Take 1 tablet (4 mg total) by mouth every 8 (eight) hours as needed., Disp: 20 tablet, Rfl: 0    pantoprazole (PROTONIX) 40 MG tablet, Take 1 tablet (40 mg total) by mouth daily., Disp: 90 tablet, Rfl: 0   pravastatin (PRAVACHOL) 20 MG tablet, TAKE 1 TABLET BY MOUTH DAILY. FOR CHOLESTEROL, Disp: 90 tablet, Rfl: 1  Observations/Objective: Patient is well-developed, well-nourished in no acute distress.  Resting comfortably at home.  Head is normocephalic, atraumatic.  No labored breathing.  Speech is clear and coherent with logical content.  Patient is alert and oriented at baseline.    Assessment and Plan: 1. Suspected COVID-19 virus infection - nirmatrelvir/ritonavir (PAXLOVID) 20 x 150 MG & 10 x 100MG  TABS; Take 3 tablets by mouth 2 (two) times daily for 5 days. (Take nirmatrelvir 150 mg two tablets twice daily for 5 days and ritonavir 100 mg one tablet twice daily for 5 days) Patient GFR is greater than 60  Dispense: 30 tablet; Refill: 0  2. Atypical pneumonia - albuterol (VENTOLIN HFA) 108 (90 Base) MCG/ACT inhaler; Inhale 1-2 puffs into the lungs every 6 (six) hours as needed.  Dispense: 8 g; Refill:  0 - doxycycline (VIBRA-TABS) 100 MG tablet; Take 1 tablet (100 mg total) by mouth 2 (two) times daily.  Dispense: 20 tablet; Refill: 0  3. Nausea - ondansetron (ZOFRAN-ODT) 4 MG disintegrating tablet; Take 1 tablet (4 mg total) by mouth every 8 (eight) hours as needed.  Dispense: 20 tablet; Refill: 0  - Continue OTC symptomatic management of choice - Will send OTC vitamins and supplement information through AVS - Paxlovid prescribed - Doxycycline for potential pneumonia (high risk and history of frequent pneumonia) - Albuterol for wheezing and shortness of breath - Zofran for nausea - Patient enrolled in MyChart symptom monitoring - Push fluids - Rest as needed - Discussed return precautions and when to seek in-person evaluation, sent via AVS as well   Follow Up Instructions: I discussed the assessment and treatment plan with the patient. The patient was provided an  opportunity to ask questions and all were answered. The patient agreed with the plan and demonstrated an understanding of the instructions.  A copy of instructions were sent to the patient via MyChart unless otherwise noted below.    The patient was advised to call back or seek an in-person evaluation if the symptoms worsen or if the condition fails to improve as anticipated.  Time:  I spent 13 minutes with the patient via telehealth technology discussing the above problems/concerns.    [name], PA-C

## 2022-09-18 NOTE — Patient Instructions (Signed)
[name], thank you for joining [name], PA-C for today's virtual visit.  While this provider is not your primary care provider (PCP), if your PCP is located in our provider database this encounter information will be shared with them immediately following your visit.   [name] account gives you access to today's visit and all your visits, tests, and labs performed at [facility] " click here if you don't have a  MyChart account or go to [email or web address]  Consent: (Patient) Debra Mullins provided verbal consent for this virtual visit at the beginning of the encounter.  Current Medications:  Current Outpatient Medications:    albuterol (VENTOLIN HFA) 108 (90 Base) MCG/ACT inhaler, Inhale 1-2 puffs into the lungs every 6 (six) hours as needed., Disp: 8 g, Rfl: 0   doxycycline (VIBRA-TABS) 100 MG tablet, Take 1 tablet (100 mg total) by mouth 2 (two) times daily., Disp: 20 tablet, Rfl: 0   nirmatrelvir/ritonavir (PAXLOVID) 20 x 150 MG & 10 x 100MG  TABS, Take 3 tablets by mouth 2 (two) times daily for 5 days. (Take nirmatrelvir 150 mg two tablets twice daily for 5 days and ritonavir 100 mg one tablet twice daily for 5 days) Patient GFR is greater than 60, Disp: 30 tablet, Rfl: 0   ondansetron (ZOFRAN-ODT) 4 MG disintegrating tablet, Take 1 tablet (4 mg total) by mouth every 8 (eight) hours as needed., Disp: 20 tablet, Rfl: 0   acetaminophen (TYLENOL) 500 MG tablet, Take 500 mg by mouth every 6 (six) hours as needed for mild pain or headache., Disp: , Rfl:    Blood Glucose Monitoring Suppl DEVI, 1 each by Does not apply route in the morning, at noon, in the evening, and at bedtime. May substitute to any manufacturer covered by patient's insurance., Disp: 1 each, Rfl: 0   cephALEXin (KEFLEX) 500 MG capsule, Take 1 capsule (500 mg total) by mouth 3 (three) times daily., Disp: 21 capsule, Rfl: 0   empagliflozin (JARDIANCE) 10 MG TABS tablet, Take 1  tablet (10 mg total) by mouth daily before breakfast., Disp: 90 tablet, Rfl: 0   insulin glargine-yfgn (SEMGLEE, YFGN,) 100 UNIT/ML Pen, INJECT 30 UNITS INTO THE SKIN AT BEDTIME AS DIRECTED, Disp: 45 mL, Rfl: 1   Insulin Pen Needle 31G X 5 MM MISC, Use daily to administer insulin., Disp: 100 each, Rfl: 1   ketoconazole (NIZORAL) 2 % cream, Apply 1 Application topically 2 (two) times daily. To rash on abdomen prn, Disp: 30 g, Rfl: 0   pantoprazole (PROTONIX) 40 MG tablet, Take 1 tablet (40 mg total) by mouth daily., Disp: 90 tablet, Rfl: 0   pravastatin (PRAVACHOL) 20 MG tablet, TAKE 1 TABLET BY MOUTH DAILY. FOR CHOLESTEROL, Disp: 90 tablet, Rfl: 1   Medications ordered in this encounter:  Meds ordered this encounter  Medications   albuterol (VENTOLIN HFA) 108 (90 Base) MCG/ACT inhaler    Sig: Inhale 1-2 puffs into the lungs every 6 (six) hours as needed.    Dispense:  8 g    Refill:  0    Order Specific Question:   Supervising Provider    Answer:   Merrilee Jansky [id number]   nirmatrelvir/ritonavir (PAXLOVID) 20 x 150 MG & 10 x 100MG  TABS    Sig: Take 3 tablets by mouth 2 (two) times daily for 5 days. (Take nirmatrelvir 150 mg two tablets twice daily for 5 days and ritonavir 100 mg one tablet twice daily for 5 days) Patient GFR is  greater than 60    Dispense:  30 tablet    Refill:  0    Order Specific Question:   Supervising Provider    Answer:   LAMPTEY, PHILIP O [[id number]]   ondansetron (ZOFRAN-ODT) 4 MG disintegrating tablet    Sig: Take 1 tablet (4 mg total) by mouth every 8 (eight) hours as needed.    Dispense:  20 tablet    Refill:  0    Order Specific Question:   Supervising Provider    Answer:   Merrilee Jansky [id number]   doxycycline (VIBRA-TABS) 100 MG tablet    Sig: Take 1 tablet (100 mg total) by mouth 2 (two) times daily.    Dispense:  20 tablet    Refill:  0    Order Specific Question:   Supervising Provider    Answer:   Merrilee Jansky [id number]     *If you  need refills on other medications prior to your next appointment, please contact your pharmacy*  Follow-Up: Call back or seek an in-person evaluation if the symptoms worsen or if the condition fails to improve as anticipated.  [facility] Virtual Care [phone]  Isolation Instructions: You are to isolate at home until you have been fever free for at least 24 hours without a fever-reducing medication, and symptoms have been steadily improving for 24 hours. At that time,  you can end isolation but need to mask for an additional 5 days.   If you must be around other household members who do not have symptoms, you need to make sure that both you and the family members are masking consistently with a high-quality mask.  If you note any worsening of symptoms despite treatment, please seek an in-person evaluation ASAP. If you note any significant shortness of breath or any chest pain, please seek ER evaluation. Please do not delay care!   COVID-19: What to Do if You Are Sick If you test positive and are an older adult or someone who is at high risk of getting very sick from COVID-19, treatment may be available. Contact a healthcare provider right away after a positive test to determine if you are eligible, even if your symptoms are mild right now. You can also visit a Test to Treat location and, if eligible, receive a prescription from a provider. Don't delay: Treatment must be started within the first few days to be effective. If you have a fever, cough, or other symptoms, you might have COVID-19. Most people have mild illness and are able to recover at home. If you are sick: Keep track of your symptoms. If you have an emergency warning sign (including trouble breathing), call 911. Steps to help prevent the spread of COVID-19 if you are sick If you are sick with COVID-19 or think you might have COVID-19, follow the steps below to care for yourself and to help protect other people in your home and  community. Stay home except to get medical care Stay home. Most people with COVID-19 have mild illness and can recover at home without medical care. Do not leave your home, except to get medical care. Do not visit public areas and do not go to places where you are unable to wear a mask. Take care of yourself. Get rest and stay hydrated. Take over-the-counter medicines, such as acetaminophen, to help you feel better. Stay in touch with your doctor. Call before you get medical care. Be sure to get care if you have trouble breathing, or  have any other emergency warning signs, or if you think it is an emergency. Avoid public transportation, ride-sharing, or taxis if possible. Get tested If you have symptoms of COVID-19, get tested. While waiting for test results, stay away from others, including staying apart from those living in your household. Get tested as soon as possible after your symptoms start. Treatments may be available for people with COVID-19 who are at risk for becoming very sick. Don't delay: Treatment must be started early to be effective--some treatments must begin within 5 days of your first symptoms. Contact your healthcare provider right away if your test result is positive to determine if you are eligible. Self-tests are one of several options for testing for the virus that causes COVID-19 and may be more convenient than laboratory-based tests and point-of-care tests. Ask your healthcare provider or your local health department if you need help interpreting your test results. You can visit your state, tribal, local, and territorial health department's website to look for the latest local information on testing sites. Separate yourself from other people As much as possible, stay in a specific room and away from other people and pets in your home. If possible, you should use a separate bathroom. If you need to be around other people or animals in or outside of the home, wear a well-fitting  mask. Tell your close contacts that they may have been exposed to COVID-19. An infected person can spread COVID-19 starting 48 hours (or 2 days) before the person has any symptoms or tests positive. By letting your close contacts know they may have been exposed to COVID-19, you are helping to protect everyone. See COVID-19 and Animals if you have questions about pets. If you are diagnosed with COVID-19, someone from the health department may call you. Answer the call to slow the spread. Monitor your symptoms Symptoms of COVID-19 include fever, cough, or other symptoms. Follow care instructions from your healthcare provider and local health department. Your local health authorities may give instructions on checking your symptoms and reporting information. When to seek emergency medical attention Look for emergency warning signs* for COVID-19. If someone is showing any of these signs, seek emergency medical care immediately: Trouble breathing Persistent pain or pressure in the chest New confusion Inability to wake or stay awake Pale, gray, or blue-colored skin, lips, or nail beds, depending on skin tone *This list is not all possible symptoms. Please call your medical provider for any other symptoms that are severe or concerning to you. Call 911 or call ahead to your local emergency facility: Notify the operator that you are seeking care for someone who has or may have COVID-19. Call ahead before visiting your doctor Call ahead. Many medical visits for routine care are being postponed or done by phone or telemedicine. If you have a medical appointment that cannot be postponed, call your doctor's office, and tell them you have or may have COVID-19. This will help the office protect themselves and other patients. If you are sick, wear a well-fitting mask You should wear a mask if you must be around other people or animals, including pets (even at home). Wear a mask with the best fit, protection, and  comfort for you. You don't need to wear the mask if you are alone. If you can't put on a mask (because of trouble breathing, for example), cover your coughs and sneezes in some other way. Try to stay at least 6 feet away from other people. This will help protect  the people around you. Masks should not be placed on young children under age 22 years, anyone who has trouble breathing, or anyone who is not able to remove the mask without help. Cover your coughs and sneezes Cover your mouth and nose with a tissue when you cough or sneeze. Throw away used tissues in a lined trash can. Immediately wash your hands with soap and water for at least 20 seconds. If soap and water are not available, clean your hands with an alcohol-based hand sanitizer that contains at least 60% alcohol. Clean your hands often Wash your hands often with soap and water for at least 20 seconds. This is especially important after blowing your nose, coughing, or sneezing; going to the bathroom; and before eating or preparing food. Use hand sanitizer if soap and water are not available. Use an alcohol-based hand sanitizer with at least 60% alcohol, covering all surfaces of your hands and rubbing them together until they feel dry. Soap and water are the best option, especially if hands are visibly dirty. Avoid touching your eyes, nose, and mouth with unwashed hands. Handwashing Tips Avoid sharing personal household items Do not share dishes, drinking glasses, cups, eating utensils, towels, or bedding with other people in your home. Wash these items thoroughly after using them with soap and water or put in the dishwasher. Clean surfaces in your home regularly Clean and disinfect high-touch surfaces (for example, doorknobs, tables, handles, light switches, and countertops) in your "sick room" and bathroom. In shared spaces, you should clean and disinfect surfaces and items after each use by the person who is ill. If you are sick and  cannot clean, a caregiver or other person should only clean and disinfect the area around you (such as your bedroom and bathroom) on an as needed basis. Your caregiver/other person should wait as long as possible (at least several hours) and wear a mask before entering, cleaning, and disinfecting shared spaces that you use. Clean and disinfect areas that may have blood, stool, or body fluids on them. Use household cleaners and disinfectants. Clean visible dirty surfaces with household cleaners containing soap or detergent. Then, use a household disinfectant. Use a product from [employer] List N: Disinfectants for Coronavirus (COVID-19). Be sure to follow the instructions on the label to ensure safe and effective use of the product. Many products recommend keeping the surface wet with a disinfectant for a certain period of time (look at "contact time" on the product label). You may also need to wear personal protective equipment, such as gloves, depending on the directions on the product label. Immediately after disinfecting, wash your hands with soap and water for 20 seconds. For completed guidance on cleaning and disinfecting your home, visit Complete Disinfection Guidance. Take steps to improve ventilation at home Improve ventilation (air flow) at home to help prevent from spreading COVID-19 to other people in your household. Clear out COVID-19 virus particles in the air by opening windows, using air filters, and turning on fans in your home. Use this interactive tool to learn how to improve air flow in your home. When you can be around others after being sick with COVID-19 Deciding when you can be around others is different for different situations. Find out when you can safely end home isolation. For any additional questions about your care, contact your healthcare provider or state or local health department. 05/31/2020 Content source: [facility]  ([facility]), Division of Viral Diseases This information is not intended  to replace advice given to you by your health care provider. Make sure you discuss any questions you have with your health care provider. Document Revised: 07/14/2020 Document Reviewed: 07/14/2020 Elsevier Patient Education  2022 [employer].     If you have been instructed to have an in-person evaluation today at a local Urgent Care facility, please use the link below. It will take you to a list of all of our available [place], including address, phone number and hours of operation. Please do not delay care.  [facility]  If you or a family member do not have a primary care provider, use the link below to schedule a visit and establish care. When you choose a [facility] primary care physician or advanced practice provider, you gain a long-term partner in health. Find a Primary Care Provider  Learn more about [facility]'s in-office and virtual care options: [place] - Get Care Now

## 2022-09-21 ENCOUNTER — Ambulatory Visit: Payer: 59 | Admitting: Nurse Practitioner

## 2022-10-04 ENCOUNTER — Telehealth: Payer: Self-pay | Admitting: "Endocrinology

## 2022-10-04 ENCOUNTER — Other Ambulatory Visit: Payer: Self-pay | Admitting: *Deleted

## 2022-10-04 DIAGNOSIS — E1159 Type 2 diabetes mellitus with other circulatory complications: Secondary | ICD-10-CM

## 2022-10-04 MED ORDER — INSULIN GLARGINE-YFGN 100 UNIT/ML ~~LOC~~ SOPN
30.0000 [IU] | PEN_INJECTOR | Freq: Every day | SUBCUTANEOUS | 1 refills | Status: DC
Start: 2022-10-04 — End: 2023-03-28

## 2022-10-04 NOTE — Telephone Encounter (Signed)
Pt needs prescription for Semglee sent to [facility] [place], which will be her new permanent pharmacy.

## 2022-10-04 NOTE — Telephone Encounter (Signed)
A prescription has been sent

## 2022-11-09 ENCOUNTER — Other Ambulatory Visit: Payer: Self-pay | Admitting: Nurse Practitioner

## 2022-11-09 ENCOUNTER — Telehealth: Payer: Self-pay | Admitting: Family Medicine

## 2022-11-09 NOTE — Telephone Encounter (Signed)
Patient states hasn't been able to get refill on

## 2022-11-09 NOTE — Telephone Encounter (Signed)
empagliflozin (JARDIANCE) 10 MG TABS tablet . She hasn't been able to get refill since 08/31/22 and is completely out of medication please advise to what is going on with this medication. [facility]

## 2022-11-09 NOTE — Telephone Encounter (Signed)
Patient notified and stated she will contact endocrinologist for further instructions

## 2022-11-09 NOTE — Telephone Encounter (Signed)
Campbell Riches, NP   I had originally refilled back in June but she needs to hold on medication due to osteomyelitis and amputation of her toe.  Recommend she discuss this at a visit with [place] or Dr. Fransico Him.

## 2022-12-07 ENCOUNTER — Telehealth: Payer: Self-pay | Admitting: "Endocrinology

## 2022-12-07 ENCOUNTER — Ambulatory Visit: Payer: Managed Care, Other (non HMO) | Admitting: "Endocrinology

## 2022-12-07 ENCOUNTER — Other Ambulatory Visit: Payer: Self-pay | Admitting: *Deleted

## 2022-12-07 DIAGNOSIS — E1159 Type 2 diabetes mellitus with other circulatory complications: Secondary | ICD-10-CM

## 2022-12-07 MED ORDER — EMPAGLIFLOZIN 10 MG PO TABS: 10.0000 mg | ORAL_TABLET | Freq: Every day | ORAL | 0 refills | Status: DC

## 2022-12-07 NOTE — Telephone Encounter (Signed)
This has been done.

## 2022-12-07 NOTE — Telephone Encounter (Signed)
Pt showed for appt but did not have reader or log sheets.  Pt is requesting a prescription for Jardiance.

## 2022-12-12 ENCOUNTER — Ambulatory Visit (INDEPENDENT_AMBULATORY_CARE_PROVIDER_SITE_OTHER): Payer: Managed Care, Other (non HMO) | Admitting: Family Medicine

## 2022-12-12 VITALS — BP 120/80 | HR 81 | Temp 97.3°F | Ht 71.0 in | Wt 285.0 lb

## 2022-12-12 DIAGNOSIS — E118 Type 2 diabetes mellitus with unspecified complications: Secondary | ICD-10-CM | POA: Insufficient documentation

## 2022-12-12 DIAGNOSIS — E782 Mixed hyperlipidemia: Secondary | ICD-10-CM

## 2022-12-12 DIAGNOSIS — K219 Gastro-esophageal reflux disease without esophagitis: Secondary | ICD-10-CM | POA: Diagnosis not present

## 2022-12-12 DIAGNOSIS — Z7984 Long term (current) use of oral hypoglycemic drugs: Secondary | ICD-10-CM

## 2022-12-12 DIAGNOSIS — Z130 Encounter for screening for diseases of the blood and blood-forming organs and certain disorders involving the immune mechanism: Secondary | ICD-10-CM | POA: Diagnosis not present

## 2022-12-12 DIAGNOSIS — E1159 Type 2 diabetes mellitus with other circulatory complications: Secondary | ICD-10-CM

## 2022-12-12 MED ORDER — PANTOPRAZOLE SODIUM 40 MG PO TBEC: 40.0000 mg | DELAYED_RELEASE_TABLET | Freq: Every day | ORAL | 1 refills | Status: DC

## 2022-12-12 MED ORDER — EMPAGLIFLOZIN 10 MG PO TABS
10.0000 mg | ORAL_TABLET | Freq: Every day | ORAL | 3 refills | Status: DC
Start: 2022-12-12 — End: 2023-02-25

## 2022-12-12 MED ORDER — DEXCOM G7 SENSOR MISC
3 refills | Status: DC
Start: 2022-12-12 — End: 2023-03-28

## 2022-12-12 NOTE — Patient Instructions (Signed)
Labs today.  Medications as prescribed.  We will call with results.

## 2022-12-13 LAB — LIPID PANEL
Chol/HDL Ratio: 6.1 {ratio} — ABNORMAL HIGH (ref 0.0–4.4)
Cholesterol, Total: 256 mg/dL — ABNORMAL HIGH (ref 100–199)
HDL: 42 mg/dL (ref >39)
LDL Chol Calc (NIH): 169 mg/dL — ABNORMAL HIGH (ref 0–99)
Triglycerides: 241 mg/dL — ABNORMAL HIGH (ref 0–149)
VLDL Cholesterol Cal: 45 mg/dL — ABNORMAL HIGH (ref 5–40)

## 2022-12-13 LAB — CMP14+EGFR
ALT: 33 [IU]/L — ABNORMAL HIGH (ref 0–32)
AST: 22 [IU]/L (ref 0–40)
Albumin: 4.3 g/dL (ref 3.9–4.9)
Alkaline Phosphatase: 94 [IU]/L (ref 44–121)
BUN/Creatinine Ratio: 11 (ref 9–23)
BUN: 8 mg/dL (ref 6–24)
Bilirubin Total: 0.3 mg/dL (ref 0.0–1.2)
CO2: 24 mmol/L (ref 20–29)
Calcium: 9.7 mg/dL (ref 8.7–10.2)
Chloride: 99 mmol/L (ref 96–106)
Creatinine, Ser: 0.75 mg/dL (ref 0.57–1.00)
Globulin, Total: 3.3 g/dL (ref 1.5–4.5)
Glucose: 177 mg/dL — ABNORMAL HIGH (ref 70–99)
Potassium: 4.5 mmol/L (ref 3.5–5.2)
Sodium: 138 mmol/L (ref 134–144)
Total Protein: 7.6 g/dL (ref 6.0–8.5)
eGFR: 101 mL/min/{1.73_m2} (ref >59)

## 2022-12-13 LAB — CBC
Hematocrit: 43.5 % (ref 34.0–46.6)
Hemoglobin: 14.5 g/dL (ref 11.1–15.9)
MCH: 30 pg (ref 26.6–33.0)
MCHC: 33.3 g/dL (ref 31.5–35.7)
MCV: 90 fL (ref 79–97)
Platelets: 348 10*3/uL (ref 150–450)
RBC: 4.83 x10E6/uL (ref 3.77–5.28)
RDW: 12.3 % (ref 11.7–15.4)
WBC: 8.6 10*3/uL (ref 3.4–10.8)

## 2022-12-13 LAB — MICROALBUMIN / CREATININE URINE RATIO
Creatinine, Urine: 58.9 mg/dL
Microalb/Creat Ratio: 22 mg/g{creat} (ref 0–29)
Microalbumin, Urine: 13.1 ug/mL

## 2022-12-13 LAB — HEMOGLOBIN A1C
Est. average glucose Bld gHb Est-mCnc: 214 mg/dL
Hgb A1c MFr Bld: 9.1 % — ABNORMAL HIGH (ref 4.8–5.6)

## 2022-12-13 NOTE — Assessment & Plan Note (Signed)
A1c returned at 9.1.  I have sent in patient's Jardiance.  She will continue current insulin dosing.  Sending in Dexcom as she reports difficulties with getting her Josephine Igo to stay on.  May need bolus insulin in the future.  Consider addition of GLP-1.

## 2022-12-13 NOTE — Assessment & Plan Note (Signed)
Lipid panel today to assess.  Would likely need high intensity statin.

## 2022-12-13 NOTE — Assessment & Plan Note (Signed)
Protonix refilled

## 2022-12-13 NOTE — Progress Notes (Signed)
Subjective:  Patient ID: Debra Mullins, female    DOB: Apr 04, 1980  Age: 42 y.o. MRN: [id number]  CC: Chief Complaint  Patient presents with   lumps on back and abdominal    Diabetes    Needs to reestablish diabetes care due to not being seen at endo cause she didn't have her list of blood sugars    HPI:  42 year old female presents for follow-up.  Patient recently had osteomyelitis and had part of her second left toe removed.  Needs better control of diabetes.  Has been followed by endocrinology.  However, she states that she was recently turned away from their office as she did not bring her blood sugar readings.  Patient is in need of A1c today.  She is currently on Semglee 30 units nightly and Jardiance.  Needs refill on Jardiance.  Patient reports that she has recently had issues with her heart rate going up and down.  She attributes this to anxiety.  She has had prior cardiac issues with Prinzmetal's angina.  Patient also reports that she has some lumps on her back and abdomen.  She would like me to examine these today.  Patient Active Problem List   Diagnosis Date Noted   Type 2 diabetes mellitus with complications (HCC) 12/12/2022   Diabetic peripheral neuropathy (HCC) 08/22/2022   Irritable bowel syndrome with constipation 08/22/2022   Class 2 severe obesity due to excess calories with serious comorbidity and body mass index (BMI) of 39.0 to 39.9 in adult [facility]) 04/27/2022   GERD (gastroesophageal reflux disease) 03/12/2022   Episode of shaking 03/12/2022   Mixed hyperlipidemia 02/23/2021   Prinzmetal angina (HCC)     Social Hx   Social History   Socioeconomic History   Marital status: Married    Spouse name: Not on file   Number of children: Not on file   Years of education: Not on file   Highest education level: Not on file  Occupational History   Not on file  Tobacco Use   Smoking status: Never   Smokeless tobacco: Never  Vaping Use   Vaping status: Never Used   Substance and Sexual Activity   Alcohol use: Never   Drug use: Never   Sexual activity: Not on file  Other Topics Concern   Not on file  Social History Narrative   Are you right handed or left handed? Right handed    Are you currently employed ? 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 No with family    What type of home do you live in: 1 story or 2 story?  1 story        Social Determinants of Corporate investment banker Strain: Not on file  Food Insecurity: Not on file  Transportation Needs: Not on file  Physical Activity: Not on file  Stress: Not on file  Social Connections: Unknown (07/25/2021)   [facility]   Social Network    Social Network: Not on file    Review of Systems Per HPI  Objective:  BP 120/80   Pulse 81   Temp (!) 97.3 F (36.3 C)   Ht 5\' 11"  (1.803 m)   Wt 285 lb (129.3 kg)   SpO2 99%   BMI 39.75 kg/m      12/12/2022    2:32 PM 08/22/2022    9:16 AM 04/27/2022    9:32 AM  BP/Weight  Systolic BP 120 128 136  Diastolic BP 80 79 82  Wt. (Lbs) 285 285 283  BMI 39.75 kg/m2 39.75 kg/m2 39.47 kg/m2    Physical Exam Vitals and nursing note reviewed.  Constitutional:      General: She is not in acute distress.    Appearance: Normal appearance.  HENT:     Head: Normocephalic and atraumatic.  Eyes:     General:        Right eye: No discharge.        Left eye: No discharge.     Conjunctiva/sclera: Conjunctivae normal.  Cardiovascular:     Rate and Rhythm: Normal rate and regular rhythm.  Pulmonary:     Effort: Pulmonary effort is normal.     Breath sounds: Normal breath sounds. No wheezing, rhonchi or rales.  Skin:    Comments: Left second toe with recent amputation.  Area is well-healed.  Patient has a firm palpable area on the left side of her upper back.  Appears benign.  Likely cyst.  No other appreciable masses in areas of concern.  Neurological:     Mental  Status: She is alert.  Psychiatric:        Mood and Affect: Mood normal.        Behavior: Behavior normal.     Lab Results  Component Value Date   WBC 8.6 12/12/2022   HGB 14.5 12/12/2022   HCT 43.5 12/12/2022   PLT 348 12/12/2022   GLUCOSE 177 (H) 12/12/2022   CHOL 256 (H) 12/12/2022   TRIG 241 (H) 12/12/2022   HDL 42 12/12/2022   LDLCALC 169 (H) 12/12/2022   ALT 33 (H) 12/12/2022   AST 22 12/12/2022   NA 138 12/12/2022   K 4.5 12/12/2022   CL 99 12/12/2022   CREATININE 0.75 12/12/2022   BUN 8 12/12/2022   CO2 24 12/12/2022   TSH 1.340 03/09/2022   HGBA1C 9.1 (H) 12/12/2022     Assessment & Plan:   Problem List Items Addressed This Visit       Digestive   GERD (gastroesophageal reflux disease)    Protonix refilled.      Relevant Medications   pantoprazole (PROTONIX) 40 MG tablet     Endocrine   Type 2 diabetes mellitus with complications (HCC) - Primary    A1c returned at 9.1.  I have sent in patient's Jardiance.  She will continue current insulin dosing.  Sending in Dexcom as she reports difficulties with getting her Josephine Igo to stay on.  May need bolus insulin in the future.  Consider addition of GLP-1.      Relevant Medications   empagliflozin (JARDIANCE) 10 MG TABS tablet   Continuous Glucose Sensor (DEXCOM G7 SENSOR) MISC   Other Relevant Orders   CMP14+EGFR (Completed)   Hemoglobin A1c (Completed)   Microalbumin / creatinine urine ratio (Completed)     Other   Mixed hyperlipidemia    Lipid panel today to assess.  Would likely need high intensity statin.      Relevant Orders   Lipid panel (Completed)   Other Visit Diagnoses     Screening for deficiency anemia       Relevant Orders   CBC (Completed)       Meds ordered this encounter  Medications   pantoprazole (PROTONIX) 40 MG tablet    Sig: Take 1 tablet (40 mg total) by mouth daily.    Dispense:  90 tablet    Refill:  1  empagliflozin (JARDIANCE) 10 MG TABS tablet    Sig: Take 1  tablet (10 mg total) by mouth daily.    Dispense:  90 tablet    Refill:  3    Patient has appointment on October 16,2024. Refill will be readdressed at that time.   Continuous Glucose Sensor (DEXCOM G7 SENSOR) MISC    Sig: Use to check blood sugar continuously. Change every 10 days.    Dispense:  6 each    Refill:  3    Follow-up:  Return in about 3 months (around 03/14/2023).  Everlene Other DO [facility]

## 2022-12-17 ENCOUNTER — Other Ambulatory Visit: Payer: Self-pay | Admitting: Family Medicine

## 2022-12-17 ENCOUNTER — Other Ambulatory Visit: Payer: Self-pay

## 2022-12-17 ENCOUNTER — Encounter: Payer: Self-pay | Admitting: Family Medicine

## 2022-12-17 DIAGNOSIS — E118 Type 2 diabetes mellitus with unspecified complications: Secondary | ICD-10-CM

## 2022-12-17 MED ORDER — CYCLOBENZAPRINE HCL 10 MG PO TABS: 10.0000 mg | ORAL_TABLET | Freq: Three times a day (TID) | ORAL | 1 refills | Status: AC | PRN

## 2022-12-17 MED ORDER — ROSUVASTATIN CALCIUM 10 MG PO TABS: 10.0000 mg | ORAL_TABLET | Freq: Every day | ORAL | 3 refills | Status: AC

## 2022-12-26 ENCOUNTER — Ambulatory Visit: Payer: Managed Care, Other (non HMO) | Admitting: "Endocrinology

## 2022-12-31 ENCOUNTER — Other Ambulatory Visit: Payer: Self-pay | Admitting: Family Medicine

## 2022-12-31 MED ORDER — SITAGLIPTIN PHOSPHATE 100 MG PO TABS: 100.0000 mg | ORAL_TABLET | Freq: Every day | ORAL | 3 refills | Status: DC

## 2023-01-02 ENCOUNTER — Telehealth: Payer: Self-pay | Admitting: Family Medicine

## 2023-01-02 DIAGNOSIS — H04123 Dry eye syndrome of bilateral lacrimal glands: Secondary | ICD-10-CM

## 2023-01-02 NOTE — Telephone Encounter (Signed)
Tommie Sams, DO     She shouldn't need a referral. But if it is required, please refer to [facility].

## 2023-01-02 NOTE — Telephone Encounter (Signed)
Patient is requesting a referral to eye specialist for dry eyes and cyst above her eye.

## 2023-01-02 NOTE — Telephone Encounter (Signed)
Referral ordered in EPIC. 

## 2023-01-14 ENCOUNTER — Ambulatory Visit: Payer: Managed Care, Other (non HMO) | Admitting: Family Medicine

## 2023-01-25 ENCOUNTER — Telehealth: Payer: Self-pay

## 2023-01-25 NOTE — Telephone Encounter (Signed)
Patient's husband calls to inform us that the pt would like to proceed with mammogram orders . He says the patient has a lump 5 in under the left arm pit that has moved into the left breast as well as lumps in both sides of her groin that may be swollen lymph nodes, he stated that she does not have any sick symptoms currently.

## 2023-01-28 NOTE — Telephone Encounter (Signed)
Pt is scheduled for a mammogram 02/04/23 at [facility]

## 2023-01-30 ENCOUNTER — Other Ambulatory Visit (HOSPITAL_COMMUNITY): Payer: Self-pay | Admitting: Family Medicine

## 2023-01-30 DIAGNOSIS — Z1231 Encounter for screening mammogram for malignant neoplasm of breast: Secondary | ICD-10-CM

## 2023-02-04 ENCOUNTER — Ambulatory Visit (HOSPITAL_COMMUNITY)
Admission: RE | Admit: 2023-02-04 | Discharge: 2023-02-04 | Disposition: A | Discharge status: Home / Self Care | Payer: Managed Care, Other (non HMO) | Source: Ambulatory Visit

## 2023-02-04 ENCOUNTER — Encounter (HOSPITAL_COMMUNITY): Payer: Self-pay

## 2023-02-04 DIAGNOSIS — Z1231 Encounter for screening mammogram for malignant neoplasm of breast: Secondary | ICD-10-CM

## 2023-02-18 ENCOUNTER — Encounter: Payer: Self-pay | Admitting: *Deleted

## 2023-02-18 ENCOUNTER — Other Ambulatory Visit: Payer: Self-pay | Admitting: Family Medicine

## 2023-02-25 ENCOUNTER — Telehealth: Payer: Self-pay | Admitting: *Deleted

## 2023-02-25 NOTE — Telephone Encounter (Signed)
Copied from CRM [id number]. Topic: Clinical - Medication Refill >> Feb 25, 2023  9:01 AM Dollene Primrose wrote: Most Recent Primary Care Visit:  Provider: Tommie Sams  Department: [facility]  Visit Type: OFFICE VISIT  Date: 12/12/2022  Medication:  empagliflozin (JARDIANCE) 10 MG TABS tablet    Has the patient contacted their pharmacy? Yes-WRONG DOSAGE WAS SENT, ALSO NEEDS PRIOR AUTHORIZATION (Agent: If no, request that the patient contact the pharmacy for the refill. If patient does not wish to contact the pharmacy document the reason why and proceed with request.) (Agent: If yes, when and what did the pharmacy advise?)  Is this the correct pharmacy for this prescription? yes If no, delete pharmacy and type the correct one.  This is the patient's preferred pharmacy:  [id number] - [place], [place] - [address] [place] [address] Phone: [phone] Fax: [phone]     Has the prescription been filled recently? no  Is the patient out of the medication? yes  Has the patient been seen for an appointment in the last year OR does the patient have an upcoming appointment? yes  Can we respond through MyChart? yes  Agent: Please be advised that Rx refills may take up to 3 business days. We ask that you follow-up with your pharmacy.

## 2023-02-25 NOTE — Telephone Encounter (Signed)
See previous message -insurance denied jardiance and prefers [place]- patient states she never started [place] but she will try medication to see how it does for her since she has a history of stomach issues

## 2023-02-26 ENCOUNTER — Encounter: Payer: Self-pay | Admitting: Family Medicine

## 2023-02-26 ENCOUNTER — Ambulatory Visit (INDEPENDENT_AMBULATORY_CARE_PROVIDER_SITE_OTHER): Payer: Managed Care, Other (non HMO) | Admitting: Family Medicine

## 2023-02-26 VITALS — BP 135/84 | HR 79 | Temp 97.9°F | Ht 71.0 in | Wt 287.0 lb

## 2023-02-26 DIAGNOSIS — E118 Type 2 diabetes mellitus with unspecified complications: Secondary | ICD-10-CM

## 2023-02-26 DIAGNOSIS — M2550 Pain in unspecified joint: Secondary | ICD-10-CM | POA: Diagnosis not present

## 2023-02-26 DIAGNOSIS — Z794 Long term (current) use of insulin: Secondary | ICD-10-CM

## 2023-02-26 DIAGNOSIS — Z1231 Encounter for screening mammogram for malignant neoplasm of breast: Secondary | ICD-10-CM

## 2023-02-26 DIAGNOSIS — R002 Palpitations: Secondary | ICD-10-CM

## 2023-02-26 NOTE — Patient Instructions (Addendum)
Call the Breast center and schedule mammogram.  [phone]  Check blood sugars.  Follow up in 1 month.

## 2023-02-27 ENCOUNTER — Ambulatory Visit: Payer: Managed Care, Other (non HMO)

## 2023-02-27 DIAGNOSIS — R002 Palpitations: Secondary | ICD-10-CM | POA: Insufficient documentation

## 2023-02-27 DIAGNOSIS — Z1231 Encounter for screening mammogram for malignant neoplasm of breast: Secondary | ICD-10-CM | POA: Insufficient documentation

## 2023-02-27 NOTE — Assessment & Plan Note (Signed)
Referring to cardiology.  Needs Zio patch.

## 2023-02-27 NOTE — Assessment & Plan Note (Signed)
There are no focal masses on breast exam.  Her areas of concern appear to be dense breast tissue.  Information given regarding the breast center so that she can obtain her mammogram. Guards to the fullness in the left supraclavicular area, there is no focal mass.  Will reevaluate in a month.

## 2023-02-27 NOTE — Progress Notes (Signed)
Subjective:  Patient ID: Debra Mullins, female    DOB: 1980/06/22  Age: 42 y.o. MRN: [id number]  CC:   Chief Complaint  Patient presents with   breast lumps lef and right noticed   neck swelling     Lumps come and go lon left side   test for lupus    HPI:  42 year old female with a history of Prinzmetal's angina, irritable bowel syndrome, GERD, type 2 diabetes, obesity, hyperlipidemia presents for evaluation of the above.  Patient recently went for screening mammogram and it was not completed due to reports of breast lumps.  She is here for exam so that she may proceed with mammogram.  Patient states that she is concerned that she has lupus.  She reports that she has had previous abnormal testing.  She would like testing today.  Patient also reports intermittent left supraclavicular fullness.  No pain.  Etiology unclear.  Additionally, patient states that 1 to 2 weeks ago she has had palpitations.  Intermittent.  She states that she has had heart rate as high as 200.  This was done via pulse ox.  Patient Active Problem List   Diagnosis Date Noted   Palpitations 02/27/2023   Screening mammogram for breast cancer 02/27/2023   Type 2 diabetes mellitus with complications (HCC) 12/12/2022   Diabetic peripheral neuropathy (HCC) 08/22/2022   Irritable bowel syndrome with constipation 08/22/2022   GERD (gastroesophageal reflux disease) 03/12/2022   Episode of shaking 03/12/2022   Mixed hyperlipidemia 02/23/2021   Prinzmetal angina (HCC)     Social Hx   Social History   Socioeconomic History   Marital status: Married    Spouse name: Not on file   Number of children: Not on file   Years of education: Not on file   Highest education level: Not on file  Occupational History   Not on file  Tobacco Use   Smoking status: Never   Smokeless tobacco: Never  Vaping Use   Vaping status: Never Used  Substance and Sexual Activity   Alcohol use: Never   Drug use: Never   Sexual  activity: Not on file  Other Topics Concern   Not on file  Social History Narrative   Are you right handed or left handed? Right handed    Are you currently employed ? 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 No with family    What type of home do you live in: 1 story or 2 story?  1 story        Social Drivers of Corporate investment banker Strain: Not on file  Food Insecurity: Not on file  Transportation Needs: Not on file  Physical Activity: Not on file  Stress: Not on file  Social Connections: Unknown (07/25/2021)   Received from [facility]   Social Network    Social Network: Not on file    Review of Systems Per HPI  Objective:  BP 135/84   Pulse 79   Temp 97.9 F (36.6 C)   Ht 5\' 11"  (1.803 m)   Wt 287 lb (130.2 kg)   SpO2 99%   BMI 40.03 kg/m      02/26/2023   11:13 AM 12/12/2022    2:32 PM 08/22/2022    9:16 AM  BP/Weight  Systolic BP 135 120 128  Diastolic BP 84 80 79  Wt. (Lbs) 287 285 285  BMI 40.03 kg/m2  39.75 kg/m2 39.75 kg/m2    Physical Exam Vitals and nursing note reviewed. Exam conducted with a chaperone present.  Constitutional:      General: She is not in acute distress.    Appearance: Normal appearance.  HENT:     Head: Normocephalic and atraumatic.  Cardiovascular:     Rate and Rhythm: Normal rate and regular rhythm.  Pulmonary:     Effort: Pulmonary effort is normal.     Breath sounds: Normal breath sounds. No wheezing or rales.  Chest:     Comments: Chaperone, Brooke present for breast exam. Breasts: breasts appear normal, no suspicious masses, no skin or nipple changes or axillary nodes  Neurological:     Mental Status: She is alert.     Lab Results  Component Value Date   WBC 8.6 12/12/2022   HGB 14.5 12/12/2022   HCT 43.5 12/12/2022   PLT 348 12/12/2022   GLUCOSE 177 (H) 12/12/2022   CHOL 256 (H) 12/12/2022   TRIG 241 (H) 12/12/2022   HDL 42 12/12/2022    LDLCALC 169 (H) 12/12/2022   ALT 33 (H) 12/12/2022   AST 22 12/12/2022   NA 138 12/12/2022   K 4.5 12/12/2022   CL 99 12/12/2022   CREATININE 0.75 12/12/2022   BUN 8 12/12/2022   CO2 24 12/12/2022   TSH 1.340 03/09/2022   HGBA1C 9.1 (H) 12/12/2022     Assessment & Plan:   Problem List Items Addressed This Visit       Endocrine   Type 2 diabetes mellitus with complications (HCC) - Primary   Uncontrolled.  Patient is not checking her blood sugar regularly.  Advised that she needs to stay on top of her diabetes.  Need adequate blood glucose readings so that we can further adjust her insulin.        Other   Palpitations   Referring to cardiology.  Needs Zio patch.      Relevant Orders   Ambulatory referral to Cardiology   Screening mammogram for breast cancer   There are no focal masses on breast exam.  Her areas of concern appear to be dense breast tissue.  Information given regarding the breast center so that she can obtain her mammogram. Guards to the fullness in the left supraclavicular area, there is no focal mass.  Will reevaluate in a month.      Other Visit Diagnoses       Arthralgia, unspecified joint       Relevant Orders   ANA      Follow-up:  Return in about 1 month (around 03/29/2023).  Everlene Other DO [facility]

## 2023-02-27 NOTE — Assessment & Plan Note (Signed)
Uncontrolled.  Patient is not checking her blood sugar regularly.  Advised that she needs to stay on top of her diabetes.  Need adequate blood glucose readings so that we can further adjust her insulin.

## 2023-03-18 ENCOUNTER — Ambulatory Visit: Payer: Managed Care, Other (non HMO) | Admitting: Family Medicine

## 2023-03-20 ENCOUNTER — Other Ambulatory Visit: Payer: Self-pay

## 2023-03-20 DIAGNOSIS — E118 Type 2 diabetes mellitus with unspecified complications: Secondary | ICD-10-CM

## 2023-03-21 ENCOUNTER — Ambulatory Visit: Payer: Managed Care, Other (non HMO)

## 2023-03-24 LAB — ANA: Anti Nuclear Antibody (ANA): NEGATIVE

## 2023-03-27 ENCOUNTER — Other Ambulatory Visit: Payer: Managed Care, Other (non HMO)

## 2023-03-28 ENCOUNTER — Ambulatory Visit (INDEPENDENT_AMBULATORY_CARE_PROVIDER_SITE_OTHER): Payer: 59 | Admitting: Family Medicine

## 2023-03-28 ENCOUNTER — Encounter: Payer: Self-pay | Admitting: Family Medicine

## 2023-03-28 VITALS — BP 126/75 | HR 84 | Temp 97.3°F | Ht 71.0 in | Wt 291.0 lb

## 2023-03-28 DIAGNOSIS — E1149 Type 2 diabetes mellitus with other diabetic neurological complication: Secondary | ICD-10-CM

## 2023-03-28 DIAGNOSIS — G5603 Carpal tunnel syndrome, bilateral upper limbs: Secondary | ICD-10-CM | POA: Diagnosis not present

## 2023-03-28 DIAGNOSIS — J019 Acute sinusitis, unspecified: Secondary | ICD-10-CM

## 2023-03-28 DIAGNOSIS — E782 Mixed hyperlipidemia: Secondary | ICD-10-CM | POA: Diagnosis not present

## 2023-03-28 DIAGNOSIS — E1159 Type 2 diabetes mellitus with other circulatory complications: Secondary | ICD-10-CM

## 2023-03-28 MED ORDER — AMOXICILLIN-POT CLAVULANATE 875-125 MG PO TABS: 1.0000 | ORAL_TABLET | Freq: Two times a day (BID) | ORAL | 0 refills | Status: DC

## 2023-03-28 MED ORDER — FREESTYLE LIBRE 3 SENSOR MISC: 3 refills | Status: AC

## 2023-03-28 MED ORDER — BACLOFEN 10 MG PO TABS: 5.0000 mg | ORAL_TABLET | Freq: Three times a day (TID) | ORAL | 0 refills | Status: DC | PRN

## 2023-03-28 MED ORDER — EMPAGLIFLOZIN 25 MG PO TABS: 25.0000 mg | ORAL_TABLET | Freq: Every day | ORAL | 5 refills | Status: DC

## 2023-03-28 MED ORDER — INSULIN GLARGINE-YFGN 100 UNIT/ML ~~LOC~~ SOPN: 35.0000 [IU] | PEN_INJECTOR | Freq: Every day | SUBCUTANEOUS | 3 refills | Status: DC

## 2023-03-28 MED ORDER — INSULIN PEN NEEDLE 31G X 5 MM MISC: 3 refills | Status: AC

## 2023-03-28 NOTE — Patient Instructions (Signed)
Medications sent in.  Follow up in 3 months.  Take care  Dr. Adriana Simas

## 2023-03-31 DIAGNOSIS — E1149 Type 2 diabetes mellitus with other diabetic neurological complication: Secondary | ICD-10-CM | POA: Insufficient documentation

## 2023-03-31 DIAGNOSIS — J329 Chronic sinusitis, unspecified: Secondary | ICD-10-CM | POA: Insufficient documentation

## 2023-03-31 DIAGNOSIS — G5600 Carpal tunnel syndrome, unspecified upper limb: Secondary | ICD-10-CM | POA: Insufficient documentation

## 2023-03-31 NOTE — Assessment & Plan Note (Signed)
Continue statin. 

## 2023-03-31 NOTE — Assessment & Plan Note (Signed)
Referring to hand surgery. 

## 2023-03-31 NOTE — Assessment & Plan Note (Signed)
Uncontrolled. Increasing Semglee to 35 units. Jardiance sent in. Freestyle Libre 3 sent in.

## 2023-03-31 NOTE — Assessment & Plan Note (Signed)
Treating with Augmentin. 

## 2023-03-31 NOTE — Progress Notes (Signed)
Subjective:  Patient ID: Debra Mullins, female    DOB: Oct 02, 1980  Age: 43 y.o. MRN: [id number]  CC:   Chief Complaint  Patient presents with   Diabetes    Follow up , elevated last night no changes noted in diet   sinus and chest congetsion     Headache and cough     HPI:  43 year old female with the below mentioned medical problems presents for follow up.   Several issues today.  Ongoing respiratory symptoms for several days. Worsening. Reports sore throat, runny nose, congestion, sinus headache/pain/pressure.   Glucose ranging from 130-190. Has Freestyle libre 2. Requesting Freestyle libre 3. Also needs Jardiance sent in as well as insulin.  A1C 9.1.  Patient states that she has carpal tunnel and she would like to see a Hydrographic surveyor. States that muscle relaxers help her symptoms. Would like one that does not make her drowsy.  Patient Active Problem List   Diagnosis Date Noted   Sinusitis 03/31/2023   Carpal tunnel syndrome 03/31/2023   Type 2 diabetes mellitus with neurological complications (HCC) 03/31/2023   Palpitations 02/27/2023   Diabetic peripheral neuropathy (HCC) 08/22/2022   Irritable bowel syndrome with constipation 08/22/2022   GERD (gastroesophageal reflux disease) 03/12/2022   Episode of shaking 03/12/2022   Mixed hyperlipidemia 02/23/2021   Prinzmetal angina (HCC)     Social Hx   Social History   Socioeconomic History   Marital status: Married    Spouse name: Not on file   Number of children: Not on file   Years of education: Not on file   Highest education level: Not on file  Occupational History   Not on file  Tobacco Use   Smoking status: Never   Smokeless tobacco: Never  Vaping Use   Vaping status: Never Used  Substance and Sexual Activity   Alcohol use: Never   Drug use: Never   Sexual activity: Not on file  Other Topics Concern   Not on file  Social History Narrative   Are you right handed or left handed? Right handed    Are you  currently employed ? 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 No with family    What type of home do you live in: 1 story or 2 story?  1 story        Social Drivers of Corporate investment banker Strain: Not on file  Food Insecurity: Not on file  Transportation Needs: Not on file  Physical Activity: Not on file  Stress: Not on file  Social Connections: Unknown (07/25/2021)   Received from [facility]   Social Network    Social Network: Not on file    Review of Systems Per HPI  Objective:  BP 126/75   Pulse 84   Temp (!) 97.3 F (36.3 C)   Ht 5\' 11"  (1.803 m)   Wt 291 lb (132 kg)   SpO2 98%   BMI 40.59 kg/m      03/28/2023    2:06 PM 02/26/2023   11:13 AM 12/12/2022    2:32 PM  BP/Weight  Systolic BP 126 135 120  Diastolic BP 75 84 80  Wt. (Lbs) 291 287 285  BMI 40.59 kg/m2 40.03 kg/m2 39.75 kg/m2    Physical Exam Vitals and nursing note reviewed.  Constitutional:      General: She is not in acute distress.  Appearance: Normal appearance.  HENT:     Head: Normocephalic and atraumatic.     Mouth/Throat:     Pharynx: Oropharynx is clear.  Eyes:     General:        Right eye: No discharge.        Left eye: No discharge.     Conjunctiva/sclera: Conjunctivae normal.  Cardiovascular:     Rate and Rhythm: Normal rate and regular rhythm.  Pulmonary:     Effort: Pulmonary effort is normal.     Breath sounds: Normal breath sounds. No wheezing, rhonchi or rales.  Neurological:     Mental Status: She is alert.  Psychiatric:        Mood and Affect: Mood normal.        Behavior: Behavior normal.     Lab Results  Component Value Date   WBC 8.6 12/12/2022   HGB 14.5 12/12/2022   HCT 43.5 12/12/2022   PLT 348 12/12/2022   GLUCOSE 177 (H) 12/12/2022   CHOL 256 (H) 12/12/2022   TRIG 241 (H) 12/12/2022   HDL 42 12/12/2022   LDLCALC 169 (H) 12/12/2022   ALT 33 (H) 12/12/2022   AST 22 12/12/2022    NA 138 12/12/2022   K 4.5 12/12/2022   CL 99 12/12/2022   CREATININE 0.75 12/12/2022   BUN 8 12/12/2022   CO2 24 12/12/2022   TSH 1.340 03/09/2022   HGBA1C 9.1 (H) 12/12/2022     Assessment & Plan:   Problem List Items Addressed This Visit       Respiratory   Sinusitis   Treating with Augmentin.      Relevant Medications   amoxicillin-clavulanate (AUGMENTIN) 875-125 MG tablet     Endocrine   Type 2 diabetes mellitus with neurological complications (HCC) - Primary   Uncontrolled. Increasing Semglee to 35 units. Jardiance and Freestyle sent in.      Relevant Medications   empagliflozin (JARDIANCE) 25 MG TABS tablet   insulin glargine-yfgn (SEMGLEE, YFGN,) 100 UNIT/ML Pen     Nervous and Auditory   Carpal tunnel syndrome   Referring to hand surgery.      Relevant Medications   baclofen (LIORESAL) 10 MG tablet   Other Relevant Orders   Ambulatory referral to Hand Surgery     Other   Mixed hyperlipidemia   Continue statin.       Meds ordered this encounter  Medications   amoxicillin-clavulanate (AUGMENTIN) 875-125 MG tablet    Sig: Take 1 tablet by mouth 2 (two) times daily.    Dispense:  14 tablet    Refill:  0   empagliflozin (JARDIANCE) 25 MG TABS tablet    Sig: Take 1 tablet (25 mg total) by mouth daily before breakfast.    Dispense:  30 tablet    Refill:  5   insulin glargine-yfgn (SEMGLEE, YFGN,) 100 UNIT/ML Pen    Sig: Inject 35 Units into the skin at bedtime.    Dispense:  15 mL    Refill:  3   Insulin Pen Needle 31G X 5 MM MISC    Sig: Use daily to administer insulin.    Dispense:  100 each    Refill:  3   Continuous Glucose Sensor (FREESTYLE LIBRE 3 SENSOR) MISC    Sig: Place 1 sensor on the skin every 14 days. Use to check glucose continuously    Dispense:  6 each    Refill:  3   baclofen (LIORESAL) 10 MG tablet  Sig: Take 0.5-1 tablets (5-10 mg total) by mouth 3 (three) times daily as needed.    Dispense:  30 each    Refill:  0     Follow-up:  Return in about 3 months (around 06/26/2023).  Everlene Other DO [facility]

## 2023-03-31 NOTE — Assessment & Plan Note (Signed)
Uncontrolled. Increasing Semglee to 35 units. Jardiance and Freestyle sent in.

## 2023-04-02 ENCOUNTER — Ambulatory Visit: Payer: Managed Care, Other (non HMO) | Admitting: "Endocrinology

## 2023-04-05 ENCOUNTER — Telehealth: Payer: Self-pay

## 2023-04-05 NOTE — Telephone Encounter (Signed)
Error

## 2023-04-08 ENCOUNTER — Ambulatory Visit: Payer: 59 | Admitting: Family Medicine

## 2023-04-17 ENCOUNTER — Other Ambulatory Visit: Payer: Self-pay | Admitting: Internal Medicine

## 2023-04-17 ENCOUNTER — Encounter: Payer: Self-pay | Admitting: Internal Medicine

## 2023-04-17 ENCOUNTER — Ambulatory Visit: Payer: 59 | Attending: Internal Medicine | Admitting: Internal Medicine

## 2023-04-17 ENCOUNTER — Ambulatory Visit: Payer: 59

## 2023-04-17 VITALS — BP 130/76 | HR 71 | Wt 300.6 lb

## 2023-04-17 DIAGNOSIS — R079 Chest pain, unspecified: Secondary | ICD-10-CM

## 2023-04-17 DIAGNOSIS — R002 Palpitations: Secondary | ICD-10-CM

## 2023-04-17 NOTE — Patient Instructions (Addendum)
 Medication Instructions:  Your physician recommends that you continue on your current medications as directed. Please refer to the Current Medication list given to you today.   Labwork: None  Testing/Procedures: Your physician has requested that you have an echocardiogram. Echocardiography is a painless test that uses sound waves to create images of your heart. It provides your doctor with information about the size and shape of your heart and how well your heart's chambers and valves are working. This procedure takes approximately one hour. There are no restrictions for this procedure. Please do NOT wear cologne, perfume, aftershave, or lotions (deodorant is allowed). Please arrive 15 minutes prior to your appointment time.  Please note: We ask at that you not bring children with you during ultrasound (echo/ vascular) testing. Due to room size and safety concerns, children are not allowed in the ultrasound rooms during exams. Our front office staff cannot provide observation of children in our lobby area while testing is being conducted. An adult accompanying a patient to their appointment will only be allowed in the ultrasound room at the discretion of the ultrasound technician under special circumstances. We apologize for any inconvenience.  Your physician has recommended that you wear a Zio monitor.   This monitor is a medical device that records the heart's electrical activity. Doctors most often use these monitors to diagnose arrhythmias. Arrhythmias are problems with the speed or rhythm of the heartbeat. The monitor is a small device applied to your chest. You can wear one while you do your normal daily activities. While wearing this monitor if you have any symptoms to push the button and record what you felt. Once you have worn this monitor for the period of time provider prescribed (for 14 days), you will return the monitor device in the postage paid box. Once it is returned they will download  the data collected and provide us  with a report which the provider will then review and we will call you with those results. Important tips:  Avoid showering during the first 24 hours of wearing the monitor. Avoid excessive sweating to help maximize wear time. Do not submerge the device, no hot tubs, and no swimming pools. Keep any lotions or oils away from the patch. After 24 hours you may shower with the patch on. Take brief showers with your back facing the shower head.  Do not remove patch once it has been placed because that will interrupt data and decrease adhesive wear time. Push the button when you have any symptoms and write down what you were feeling. Once you have completed wearing your monitor, remove and place into box which has postage paid and place in your outgoing mailbox.  If for some reason you have misplaced your box then call our office and we can provide another box and/or mail it off for you.     Please report to Radiology at the [facility] Main Entrance 30 minutes early for your test.  [address] [place], [place] [address]                    How to Prepare for Your Cardiac PET/CT Stress Test:  Nothing to eat or drink, except water, 3 hours prior to arrival time.  NO caffeine/decaffeinated products, or chocolate 12 hours prior to arrival. (Please note decaffeinated beverages (teas/coffees) still contain caffeine).  If you have caffeine within 12 hours prior, the test will need to be rescheduled.  Medication instructions: Do not take nitrates (  isosorbide mononitrate, Ranexa) the day before or day of test Do not take tamsulosin the day before or morning of test Hold theophylline containing medications for 12 hours. Hold Dipyridamole 48 hours prior to the test.  Diabetic Preparation: If able to eat breakfast prior to 3 hour fasting, you may take all medications, including your insulin . Do not worry if you miss your breakfast dose of insulin  - start  at your next meal. If you do not eat prior to 3 hour fast-Hold all diabetes (oral and insulin ) medications. Patients who wear a continuous glucose monitor MUST remove the device prior to scanning.  You may take your remaining medications with water.  NO perfume, cologne or lotion on chest or abdomen area. FEMALES - Please avoid wearing dresses to this appointment.  Total time is 1 to 2 hours; you may want to bring reading material for the waiting time.  IF YOU THINK YOU MAY BE PREGNANT, OR ARE NURSING PLEASE INFORM THE TECHNOLOGIST.  In preparation for your appointment, medication and supplies will be purchased.  Appointment availability is limited, so if you need to cancel or reschedule, please call the Radiology Department Scheduler at [phone] 24 hours in advance to avoid a cancellation fee of $100.00  What to Expect When you Arrive:  Once you arrive and check in for your appointment, you will be taken to a preparation room within the Radiology Department.  A technologist or Nurse will obtain your medical history, verify that you are correctly prepped for the exam, and explain the procedure.  Afterwards, an IV will be started in your arm and electrodes will be placed on your skin for EKG monitoring during the stress portion of the exam. Then you will be escorted to the PET/CT scanner.  There, staff will get you positioned on the scanner and obtain a blood pressure and EKG.  During the exam, you will continue to be connected to the EKG and blood pressure machines.  A small, safe amount of a radioactive tracer will be injected in your IV to obtain a series of pictures of your heart along with an injection of a stress agent.    After your Exam:  It is recommended that you eat a meal and drink a caffeinated beverage to counter act any effects of the stress agent.  Drink plenty of fluids for the remainder of the day and urinate frequently for the first couple of hours after the exam.  Your  doctor will inform you of your test results within 7-10 business days.  For more information and frequently asked questions, please visit our website: [email or web address]  For questions about your test or how to prepare for your test, please call: Cardiac Imaging Nurse Navigators Office: [phone]     Follow-Up: Your physician recommends that you schedule a follow-up appointment in: Pending Results  Any Other Special Instructions Will Be Listed Below (If Applicable). Thank you for choosing [facility]!      If you need a refill on your cardiac medications before your next appointment, please call your pharmacy.

## 2023-04-17 NOTE — Progress Notes (Addendum)
 Cardiology Office Note  Date: 04/17/2023   ID: Debra Mullins, DOB May 24, 1980, MRN [id number]  PCP:  Bluford Jacqulyn MATSU, DO  Cardiologist:  Diannah SHAUNNA Maywood, MD Electrophysiologist:  None   History of Present Illness: Debra Mullins is a 43 y.o. female  Ongoing chest pains both with rest and exertion for quite a while.  She also has shortness of breath with exertion that she noticed around 3 weeks ago.  She was recovering from cold and she thinks this could be from bronchitis/pneumonia.  She also noticed her chest pain is getting worse from prior.  She also has palpitations ongoing for quite a while.  Almost occurs daily.  Last for a few minutes.  Underwent LHC around 9 years ago that showed normal coronaries, according to the patient.  I do not have the report to review.  This was in [place] .  She underwent NM stress test in 2021 that showed no evidence of ischemia.  No other symptoms of syncope, leg swelling.  Past Medical History:  Diagnosis Date   Diabetes (HCC)    Heart attack (HCC) 2015   Prinzmetal angina (HCC)    Echo 2/22: EF 55-60, no RWMA, normal RVSF, no MR    Past Surgical History:  Procedure Laterality Date   HYSTEROTOMY     OOPHORECTOMY      Current Outpatient Medications  Medication Sig Dispense Refill   acetaminophen (TYLENOL) 500 MG tablet Take 500 mg by mouth every 6 (six) hours as needed for mild pain or headache.     baclofen  (LIORESAL ) 10 MG tablet Take 0.5-1 tablets (5-10 mg total) by mouth 3 (three) times daily as needed. 30 each 0   Continuous Glucose Sensor (FREESTYLE LIBRE 3 SENSOR) MISC Place 1 sensor on the skin every 14 days. Use to check glucose continuously 6 each 3   cyclobenzaprine  (FLEXERIL ) 10 MG tablet Take 1 tablet (10 mg total) by mouth 3 (three) times daily as needed for muscle spasms. 90 tablet 1   empagliflozin  (JARDIANCE ) 25 MG TABS tablet Take 1 tablet (25 mg total) by mouth daily before breakfast. 30 tablet 5   Insulin  Pen Needle 31G X 5 MM MISC  Use daily to administer insulin . 100 each 3   LANTUS  SOLOSTAR 100 UNIT/ML Solostar Pen Inject into the skin.     pantoprazole  (PROTONIX ) 40 MG tablet Take 1 tablet (40 mg total) by mouth daily. 90 tablet 1   rosuvastatin  (CRESTOR ) 10 MG tablet Take 1 tablet (10 mg total) by mouth daily. 90 tablet 3   amoxicillin -clavulanate (AUGMENTIN ) 875-125 MG tablet Take 1 tablet by mouth 2 (two) times daily. (Patient not taking: Reported on 04/17/2023) 14 tablet 0   insulin  glargine-yfgn (SEMGLEE , YFGN,) 100 UNIT/ML Pen Inject 35 Units into the skin at bedtime. (Patient not taking: Reported on 04/17/2023) 15 mL 3   No current facility-administered medications for this visit.   Allergies:  Sulfa antibiotics, Metformin and related, Zinc, Zithromax [azithromycin], and Prednisone   Social History: The patient  reports that she has never smoked. She has never used smokeless tobacco. She reports that she does not drink alcohol and does not use drugs.   Family History: The patient's family history includes CAD in her mother; Heart attack in her paternal aunt.   ROS:  Please see the history of present illness. Otherwise, complete review of systems is positive for none  All other systems are reviewed and negative.   Physical Exam: VS:  BP 130/76   Pulse  71   Wt (!) 300 lb 9.6 oz (136.4 kg)   SpO2 98%   BMI 41.93 kg/m , BMI Body mass index is 41.93 kg/m.  Wt Readings from Last 3 Encounters:  04/17/23 (!) 300 lb 9.6 oz (136.4 kg)  03/28/23 291 lb (132 kg)  02/26/23 287 lb (130.2 kg)    General: Patient appears comfortable at rest. HEENT: Conjunctiva and lids normal, oropharynx clear with moist mucosa. Neck: Supple, no elevated JVP or carotid bruits, no thyromegaly. Lungs: Clear to auscultation, nonlabored breathing at rest. Cardiac: Regular rate and rhythm, no S3 or significant systolic murmur, no pericardial rub. Abdomen: Soft, nontender, no hepatomegaly, bowel sounds present, no guarding or  rebound. Extremities: No pitting edema, distal pulses 2+. Skin: Warm and dry. Musculoskeletal: No kyphosis. Neuropsychiatric: Alert and oriented x3, affect grossly appropriate.  Recent Labwork: 12/12/2022: ALT 33; AST 22; BUN 8; Creatinine, Ser 0.75; Hemoglobin 14.5; Platelets 348; Potassium 4.5; Sodium 138     Component Value Date/Time   CHOL 256 (H) 12/12/2022 1516   TRIG 241 (H) 12/12/2022 1516   HDL 42 12/12/2022 1516   CHOLHDL 6.1 (H) 12/12/2022 1516   LDLCALC 169 (H) 12/12/2022 1516     Assessment and Plan:  Chest pain: Ongoing daily chest pains x quite a while.  Underwent LHC around 9 years ago that showed normal coronaries.  NM stress test from 2021 showed no evidence of ischemia.  Obtain cardiac stress PET to rule out any coronary microvascular dysfunction due to cardiac risk factors of diabetes mellitus.  Obtain echocardiogram.  Palpitations: Ongoing daily palpitations x quite a while.  Obtain 2-week event monitor.  HLD, not at goal: Continue rosuvastatin  10 mg nightly.  Goal LDL less than 100.  Exercise and heart healthy diet.  Aggressive control of DM 2 for TG control.  Can follow with PCP for HLD management.       Medication Adjustments/Labs and Tests Ordered: Current medicines are reviewed at length with the patient today.  Concerns regarding medicines are outlined above.    Disposition:  Follow up  pending results  Signed Obbie Lewallen Priya Donie Moulton, MD, 04/17/2023 2:51 PM    [facility] at [facility] [address] [place], [place], [place] [address]

## 2023-04-29 ENCOUNTER — Ambulatory Visit: Payer: 59 | Attending: Internal Medicine

## 2023-04-29 DIAGNOSIS — R079 Chest pain, unspecified: Secondary | ICD-10-CM | POA: Diagnosis not present

## 2023-04-29 LAB — ECHOCARDIOGRAM COMPLETE
AR max vel: 2.48 cm2
AV Area VTI: 2.7 cm2
AV Area mean vel: 2.26 cm2
AV Mean grad: 4 mm[Hg]
AV Peak grad: 7.5 mm[Hg]
Ao pk vel: 1.37 m/s
Area-P 1/2: 3.89 cm2
Calc EF: 55.6 %
MV VTI: 3.51 cm2
S' Lateral: 3.4 cm
Single Plane A2C EF: 55.1 %
Single Plane A4C EF: 55.3 %

## 2023-05-02 ENCOUNTER — Encounter: Payer: Self-pay | Admitting: *Deleted

## 2023-05-04 ENCOUNTER — Ambulatory Visit
Admission: RE | Admit: 2023-05-04 | Discharge: 2023-05-04 | Disposition: A | Discharge status: Home / Self Care | Payer: 59 | Source: Ambulatory Visit | Attending: Family Medicine | Admitting: Family Medicine

## 2023-05-04 DIAGNOSIS — Z1231 Encounter for screening mammogram for malignant neoplasm of breast: Secondary | ICD-10-CM

## 2023-05-07 ENCOUNTER — Encounter: Payer: Self-pay | Admitting: Family Medicine

## 2023-05-08 ENCOUNTER — Other Ambulatory Visit: Payer: Self-pay | Admitting: Family Medicine

## 2023-05-08 DIAGNOSIS — R928 Other abnormal and inconclusive findings on diagnostic imaging of breast: Secondary | ICD-10-CM

## 2023-05-10 ENCOUNTER — Ambulatory Visit: Payer: Managed Care, Other (non HMO)

## 2023-05-14 ENCOUNTER — Encounter: Payer: Self-pay | Admitting: Family Medicine

## 2023-05-14 ENCOUNTER — Ambulatory Visit: Admitting: Family Medicine

## 2023-05-14 ENCOUNTER — Ambulatory Visit (HOSPITAL_COMMUNITY)
Admission: RE | Admit: 2023-05-14 | Discharge: 2023-05-14 | Disposition: A | Discharge status: Home / Self Care | Source: Ambulatory Visit | Attending: Family Medicine | Admitting: Family Medicine

## 2023-05-14 VITALS — BP 128/82 | HR 90 | Temp 98.2°F | Ht 71.0 in | Wt 305.0 lb

## 2023-05-14 DIAGNOSIS — J020 Streptococcal pharyngitis: Secondary | ICD-10-CM | POA: Diagnosis not present

## 2023-05-14 DIAGNOSIS — R053 Chronic cough: Secondary | ICD-10-CM | POA: Insufficient documentation

## 2023-05-14 LAB — POCT RAPID STREP A (OFFICE): Rapid Strep A Screen: POSITIVE — AB

## 2023-05-14 MED ORDER — CEFDINIR 300 MG PO CAPS: 300.0000 mg | ORAL_CAPSULE | Freq: Two times a day (BID) | ORAL | 0 refills | Status: DC

## 2023-05-14 NOTE — Assessment & Plan Note (Signed)
X-ray for further evaluation.

## 2023-05-14 NOTE — Assessment & Plan Note (Signed)
 Strep positive today. Treating with Omnicef (given recent Augmentin use).

## 2023-05-14 NOTE — Progress Notes (Signed)
 Subjective:  Patient ID: Debra Mullins, female    DOB: 20-Mar-1980  Age: 43 y.o. MRN: [id number]  CC:   Chief Complaint  Patient presents with   sore throat and cough still going on    HPI:  43 year old female presents with the above complaints.  Reports that symptoms continue to persist. Seem to have improved from last visit but then recurred recently.  Reports current sore throat and cough (slightly productive of "pink" sputum).  Also reports SOB (ongoing) as well as headache. No fever. No relieving factors. No other complaints.  Patient Active Problem List   Diagnosis Date Noted   Chronic cough 05/14/2023   Strep pharyngitis 05/14/2023   Carpal tunnel syndrome 03/31/2023   Type 2 diabetes mellitus with neurological complications (HCC) 03/31/2023   Palpitations 02/27/2023   Diabetic peripheral neuropathy (HCC) 08/22/2022   Irritable bowel syndrome with constipation 08/22/2022   GERD (gastroesophageal reflux disease) 03/12/2022   Episode of shaking 03/12/2022   Mixed hyperlipidemia 02/23/2021   Prinzmetal angina (HCC)     Social Hx   Social History   Socioeconomic History   Marital status: Married    Spouse name: Not on file   Number of children: Not on file   Years of education: Not on file   Highest education level: Not on file  Occupational History   Not on file  Tobacco Use   Smoking status: Never   Smokeless tobacco: Never  Vaping Use   Vaping status: Never Used  Substance and Sexual Activity   Alcohol use: Never   Drug use: Never   Sexual activity: Not on file  Other Topics Concern   Not on file  Social History Narrative   Are you right handed or left handed? Right handed    Are you currently employed ? 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 No with family    What type of home do you live in: 1 story or 2 story?  1 story        Social Drivers of Corporate investment banker Strain: Not on file  Food  Insecurity: Not on file  Transportation Needs: Not on file  Physical Activity: Not on file  Stress: Not on file  Social Connections: Unknown (07/25/2021)   Received from [facility]   Social Network    Social Network: Not on file    Review of Systems Per HPI  Objective:  BP 128/82   Pulse 90   Temp 98.2 F (36.8 C)   Ht 5\' 11"  (1.803 m)   Wt (!) 305 lb (138.3 kg)   SpO2 100%   BMI 42.54 kg/m      05/14/2023    9:35 AM 04/17/2023   10:43 AM 03/28/2023    2:06 PM  BP/Weight  Systolic BP 128 130 126  Diastolic BP 82 76 75  Wt. (Lbs) 305 300.6 291  BMI 42.54 kg/m2 41.93 kg/m2 40.59 kg/m2    Physical Exam Vitals and nursing note reviewed.  Constitutional:      General: She is not in acute distress.    Appearance: Normal appearance.  HENT:     Head: Normocephalic and atraumatic.     Mouth/Throat:     Pharynx: Oropharynx is clear.  Cardiovascular:     Rate and Rhythm: Normal rate and regular rhythm.  Pulmonary:     Effort: Pulmonary effort is normal.  Breath sounds: Normal breath sounds. No wheezing or rales.  Neurological:     Mental Status: She is alert.  Psychiatric:        Mood and Affect: Mood normal.        Behavior: Behavior normal.     Lab Results  Component Value Date   WBC 8.6 12/12/2022   HGB 14.5 12/12/2022   HCT 43.5 12/12/2022   PLT 348 12/12/2022   GLUCOSE 177 (H) 12/12/2022   CHOL 256 (H) 12/12/2022   TRIG 241 (H) 12/12/2022   HDL 42 12/12/2022   LDLCALC 169 (H) 12/12/2022   ALT 33 (H) 12/12/2022   AST 22 12/12/2022   NA 138 12/12/2022   K 4.5 12/12/2022   CL 99 12/12/2022   CREATININE 0.75 12/12/2022   BUN 8 12/12/2022   CO2 24 12/12/2022   TSH 1.340 03/09/2022   HGBA1C 9.1 (H) 12/12/2022     Assessment & Plan:  Chronic cough Assessment & Plan: Xray for further evaluation.   Orders: -     DG Chest 2 View  Strep pharyngitis Assessment & Plan: Strep positive today. Treating with Omnicef (given recent  Augmentin use).  Orders: -     POCT rapid strep A -     Cefdinir; Take 1 capsule (300 mg total) by mouth 2 (two) times daily.  Dispense: 20 capsule; Refill: 0   Follow-up:  Return if symptoms worsen or fail to improve.  Everlene Other DO [facility]

## 2023-05-14 NOTE — Patient Instructions (Signed)
 Medication as prescribed.  Chest xray at the hospital. We will call with results.  Take care  Dr. Adriana Simas

## 2023-05-20 ENCOUNTER — Ambulatory Visit: Payer: Self-pay | Admitting: Family Medicine

## 2023-05-20 NOTE — Telephone Encounter (Signed)
 Called pt back - "Call cannot be completed as dialed".

## 2023-05-20 NOTE — Telephone Encounter (Signed)
 Copied from CRM [id number]. Topic: Clinical - Red Word Triage >> May 20, 2023  8:38 AM Elle L wrote: Red Word that prompted transfer to Nurse Triage: The patient's husband, Tryphena Perkovich, states that the patient was put on an antibiotic at her last appointment but has not gotten better. She has strep, a cough, and cold symptoms.  He declined nurse triage and requested that I transfer him directly to the office. I attempted to call per his request but they were not available at this time as there was no answer. When I advised him of this he then requested to be transferred to a nurse.  Attempted to call patient and husband contact numbers- message- call can not be completed as dialed for both

## 2023-05-20 NOTE — Telephone Encounter (Signed)
 3rd attempt, called pt received busy signal, called pt's husband call cannot be completed as dialed. Will route to practice for review.   Copied from CRM [id number]. Topic: Clinical - Red Word Triage >> May 20, 2023  8:38 AM Elle L wrote: Red Word that prompted transfer to Nurse Triage: The patient's husband, Erick Oxendine, states that the patient was put on an antibiotic at her last appointment but has not gotten better. She has strep, a cough, and cold symptoms.   He declined nurse triage and requested that I transfer him directly to the office. I attempted to call per his request but they were not available at this time as there was no answer. When I advised him of this he then requested to be transferred to a nurse.

## 2023-05-29 ENCOUNTER — Ambulatory Visit
Admission: RE | Admit: 2023-05-29 | Discharge: 2023-05-29 | Disposition: A | Discharge status: Home / Self Care | Source: Ambulatory Visit | Attending: Family Medicine | Admitting: Family Medicine

## 2023-05-29 ENCOUNTER — Other Ambulatory Visit: Payer: Self-pay | Admitting: Family Medicine

## 2023-05-29 DIAGNOSIS — R928 Other abnormal and inconclusive findings on diagnostic imaging of breast: Secondary | ICD-10-CM

## 2023-05-29 DIAGNOSIS — N6320 Unspecified lump in the left breast, unspecified quadrant: Secondary | ICD-10-CM

## 2023-05-29 DIAGNOSIS — N6310 Unspecified lump in the right breast, unspecified quadrant: Secondary | ICD-10-CM

## 2023-06-03 ENCOUNTER — Other Ambulatory Visit: Payer: 59

## 2023-06-06 ENCOUNTER — Telehealth: Payer: Self-pay

## 2023-06-06 MED ORDER — DILTIAZEM HCL 30 MG PO TABS: 30.0000 mg | ORAL_TABLET | Freq: Three times a day (TID) | ORAL | 3 refills | Status: DC | PRN

## 2023-06-06 NOTE — Telephone Encounter (Signed)
-----   Message from Vishnu P Mallipeddi sent at 06/05/2023 10:31 AM EDT ----- Average HR 91 bpm, symptoms correlate with NSR, 85 to 99 bpm. Can try diltiazem 30 mg Q8h PRN for palpitations, due to average HR > 90 bpm for symptomatic relief.

## 2023-06-06 NOTE — Telephone Encounter (Signed)
 The patient has been notified of the result and verbalized understanding.  All questions (if any) were answered. Roseanne Reno, CMA 06/06/2023 9:20 AM

## 2023-06-10 ENCOUNTER — Ambulatory Visit (INDEPENDENT_AMBULATORY_CARE_PROVIDER_SITE_OTHER): Admitting: Family Medicine

## 2023-06-10 ENCOUNTER — Ambulatory Visit: Payer: Self-pay

## 2023-06-10 ENCOUNTER — Encounter: Payer: Self-pay | Admitting: Family Medicine

## 2023-06-10 VITALS — BP 140/80 | HR 93 | Temp 97.9°F | Ht 71.0 in | Wt 303.0 lb

## 2023-06-10 DIAGNOSIS — E1149 Type 2 diabetes mellitus with other diabetic neurological complication: Secondary | ICD-10-CM

## 2023-06-10 DIAGNOSIS — R002 Palpitations: Secondary | ICD-10-CM | POA: Diagnosis not present

## 2023-06-10 DIAGNOSIS — R0609 Other forms of dyspnea: Secondary | ICD-10-CM

## 2023-06-10 DIAGNOSIS — R221 Localized swelling, mass and lump, neck: Secondary | ICD-10-CM | POA: Diagnosis not present

## 2023-06-10 DIAGNOSIS — E782 Mixed hyperlipidemia: Secondary | ICD-10-CM

## 2023-06-10 NOTE — Assessment & Plan Note (Signed)
 No appreciable adenopathy. Labs and [place] for further evaluation.

## 2023-06-10 NOTE — Assessment & Plan Note (Signed)
 Normal Echo. Recent xray clear. Obesity likely playing a role. Awaiting upcoming stress PET.
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
## 2023-06-10 NOTE — Patient Instructions (Signed)
 Labs ordered.  [place] ordered.  See if cardiology can move up the stress test.  Take care  Dr. Adriana Simas
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
## 2023-06-11 ENCOUNTER — Ambulatory Visit (HOSPITAL_BASED_OUTPATIENT_CLINIC_OR_DEPARTMENT_OTHER)

## 2023-06-11 LAB — MICROALBUMIN / CREATININE URINE RATIO
Creatinine, Urine: 33.1 mg/dL
Microalb/Creat Ratio: 18 mg/g{creat} (ref 0–29)
Microalbumin, Urine: 6.1 ug/mL

## 2023-06-11 LAB — CBC WITH DIFFERENTIAL/PLATELET
Basophils Absolute: 0 10*3/uL (ref 0.0–0.2)
Basos: 1 %
EOS (ABSOLUTE): 0.2 10*3/uL (ref 0.0–0.4)
Eos: 3 %
Hematocrit: 41.6 % (ref 34.0–46.6)
Hemoglobin: 13.6 g/dL (ref 11.1–15.9)
Immature Grans (Abs): 0 10*3/uL (ref 0.0–0.1)
Immature Granulocytes: 0 %
Lymphocytes Absolute: 2.5 10*3/uL (ref 0.7–3.1)
Lymphs: 34 %
MCH: 29.6 pg (ref 26.6–33.0)
MCHC: 32.7 g/dL (ref 31.5–35.7)
MCV: 90 fL (ref 79–97)
Monocytes Absolute: 0.5 10*3/uL (ref 0.1–0.9)
Monocytes: 7 %
Neutrophils Absolute: 4 10*3/uL (ref 1.4–7.0)
Neutrophils: 55 %
Platelets: 310 10*3/uL (ref 150–450)
RBC: 4.6 x10E6/uL (ref 3.77–5.28)
RDW: 12.7 % (ref 11.7–15.4)
WBC: 7.3 10*3/uL (ref 3.4–10.8)

## 2023-06-11 LAB — CMP14+EGFR
ALT: 43 IU/L — ABNORMAL HIGH (ref 0–32)
AST: 22 IU/L (ref 0–40)
Albumin: 4.3 g/dL (ref 3.9–4.9)
Alkaline Phosphatase: 106 IU/L (ref 44–121)
BUN/Creatinine Ratio: 16 (ref 9–23)
BUN: 11 mg/dL (ref 6–24)
Bilirubin Total: <0.2 mg/dL (ref 0.0–1.2)
CO2: 21 mmol/L (ref 20–29)
Calcium: 9.6 mg/dL (ref 8.7–10.2)
Chloride: 104 mmol/L (ref 96–106)
Creatinine, Ser: 0.67 mg/dL (ref 0.57–1.00)
Globulin, Total: 2.9 g/dL (ref 1.5–4.5)
Glucose: 246 mg/dL — ABNORMAL HIGH (ref 70–99)
Potassium: 4.4 mmol/L (ref 3.5–5.2)
Sodium: 140 mmol/L (ref 134–144)
Total Protein: 7.2 g/dL (ref 6.0–8.5)
eGFR: 110 mL/min/{1.73_m2} (ref >59)

## 2023-06-11 LAB — LIPID PANEL
Chol/HDL Ratio: 6.4 ratio — ABNORMAL HIGH (ref 0.0–4.4)
Cholesterol, Total: 238 mg/dL — ABNORMAL HIGH (ref 100–199)
HDL: 37 mg/dL — ABNORMAL LOW (ref >39)
LDL Chol Calc (NIH): 140 mg/dL — ABNORMAL HIGH (ref 0–99)
Triglycerides: 335 mg/dL — ABNORMAL HIGH (ref 0–149)
VLDL Cholesterol Cal: 61 mg/dL — ABNORMAL HIGH (ref 5–40)

## 2023-06-11 LAB — T3, FREE: T3, Free: 2.7 pg/mL (ref 2.0–4.4)

## 2023-06-11 LAB — TSH+FREE T4
Free T4: 1.11 ng/dL (ref 0.82–1.77)
TSH: 1.38 u[IU]/mL (ref 0.450–4.500)

## 2023-06-11 LAB — HEMOGLOBIN A1C
Est. average glucose Bld gHb Est-mCnc: 243 mg/dL
Hgb A1c MFr Bld: 10.1 % — ABNORMAL HIGH (ref 4.8–5.6)

## 2023-06-12 ENCOUNTER — Encounter: Payer: Self-pay | Admitting: Family Medicine

## 2023-06-14 ENCOUNTER — Ambulatory Visit (HOSPITAL_COMMUNITY)
Admission: RE | Admit: 2023-06-14 | Discharge: 2023-06-14 | Disposition: A | Discharge status: Home / Self Care | Source: Ambulatory Visit | Attending: Family Medicine | Admitting: Family Medicine

## 2023-06-14 DIAGNOSIS — R221 Localized swelling, mass and lump, neck: Secondary | ICD-10-CM | POA: Diagnosis present

## 2023-06-16 ENCOUNTER — Encounter: Payer: Self-pay | Admitting: Family Medicine

## 2023-06-17 ENCOUNTER — Other Ambulatory Visit: Payer: Self-pay | Admitting: Family Medicine

## 2023-06-17 NOTE — Telephone Encounter (Signed)
 Copied from CRM [id number]. Topic: Clinical - Medication Refill >> Jun 17, 2023 11:07 AM Elle L wrote: Most Recent Primary Care Visit:  Provider: Tommie Sams  Department: [facility]  Visit Type: ACUTE  Date: 06/10/2023  Medication: baclofen (LIORESAL) 10 MG tablet  Has the patient contacted their pharmacy? Yes  Is this the correct pharmacy for this prescription? Yes  This is the patient's preferred pharmacy:   [facility] [address] [place] - [address] [place] [address] [place] [address] Phone: [phone] Fax: [phone]  Has the prescription been filled recently? No  Is the patient out of the medication? Yes  Has the patient been seen for an appointment in the last year OR does the patient have an upcoming appointment? Yes  Can we respond through MyChart? Yes  Agent: Please be advised that Rx refills may take up to 3 business days. We ask that you follow-up with your pharmacy.

## 2023-06-18 MED ORDER — BACLOFEN 10 MG PO TABS: 5.0000 mg | ORAL_TABLET | Freq: Three times a day (TID) | ORAL | 0 refills | Status: AC | PRN

## 2023-06-26 ENCOUNTER — Ambulatory Visit: Payer: Self-pay | Admitting: Family Medicine

## 2023-06-27 ENCOUNTER — Telehealth: Payer: Self-pay | Admitting: Internal Medicine

## 2023-06-27 DIAGNOSIS — R002 Palpitations: Secondary | ICD-10-CM

## 2023-06-27 DIAGNOSIS — R0609 Other forms of dyspnea: Secondary | ICD-10-CM

## 2023-06-27 NOTE — Telephone Encounter (Signed)
 No answer

## 2023-06-27 NOTE — Telephone Encounter (Signed)
 Pt c/o medication issue:  1. Name of Medication:   diltiazem (CARDIZEM) 30 MG tablet    2. How are you currently taking this medication (dosage and times per day)? As written   3. Are you having a reaction (difficulty breathing--STAT)? No   4. What is your medication issue? Pt spouse called in stating that this medication is not working for the patient. He states that her hr is still elevated around 110. He states she is still have SOB with exertion. Please advise. He asked that someone call the patient, instead of him.

## 2023-06-28 NOTE — Telephone Encounter (Signed)
 Left a message on patient's phone to call office back regarding hr/medication.

## 2023-07-02 NOTE — Telephone Encounter (Signed)
 Left a message on patient's phone to call office back regarding hr/medication.

## 2023-07-04 NOTE — Telephone Encounter (Signed)
 Patient states the medication has made her HR elevated since starting medication. She gets sob very easy  Patient feels the MD is dismissing their concerns and dismissing her as being overweight husband states that no labs or test were run that MD just put her on medication and they feel their issues were just dismissed.  Patient is still taking the diltiazem  currently  Hr- low 100's resting 103,105  Patient and husband feel she needs an appointment to express these issues and concerns with MD

## 2023-07-05 NOTE — Telephone Encounter (Signed)
 Patient informed and verbalized understanding of plan. Patient would like to see Dr.Mallipeddi for first available will send to scheduling so they can call patient and get her scheduled.  Patient will d/c diltiazem  and will obtain labwork next week

## 2023-07-16 ENCOUNTER — Other Ambulatory Visit (HOSPITAL_COMMUNITY): Payer: 59

## 2023-07-30 ENCOUNTER — Other Ambulatory Visit: Payer: Self-pay | Admitting: Family Medicine

## 2023-07-31 ENCOUNTER — Other Ambulatory Visit: Payer: Self-pay | Admitting: Family Medicine

## 2023-07-31 MED ORDER — PANTOPRAZOLE SODIUM 40 MG PO TBEC: 40.0000 mg | DELAYED_RELEASE_TABLET | Freq: Every day | ORAL | 1 refills | Status: DC

## 2023-07-31 NOTE — Telephone Encounter (Signed)
 Copied from CRM [id number]. Topic: Clinical - Medication Refill >> Jul 31, 2023  9:25 AM Baldomero Bone wrote: Medication: pantoprazole  (PROTONIX ) 40 MG tablet  Has the patient contacted their pharmacy? Yes (Agent: If no, request that the patient contact the pharmacy for the refill. If patient does not wish to contact the pharmacy document the reason why and proceed with request.) (Agent: If yes, when and what did the pharmacy advise?)No refills   This is the patient's preferred pharmacy:  [facility] [address], [place] - [address] [place] [address] Phone: [phone] Fax: [phone]  Is this the correct pharmacy for this prescription? Yes If no, delete pharmacy and type the correct one.   Has the prescription been filled recently? No  Is the patient out of the medication? Yes  Has the patient been seen for an appointment in the last year OR does the patient have an upcoming appointment? Yes  Can we respond through MyChart? Yes  Agent: Please be advised that Rx refills may take up to 3 business days. We ask that you follow-up with your pharmacy.

## 2023-08-22 ENCOUNTER — Ambulatory Visit: Attending: Internal Medicine | Admitting: Internal Medicine

## 2023-08-22 NOTE — Progress Notes (Signed)
 Erroneous encounter - please disregard.

## 2023-09-27 ENCOUNTER — Encounter (HOSPITAL_COMMUNITY): Payer: Self-pay

## 2023-10-01 ENCOUNTER — Ambulatory Visit (HOSPITAL_COMMUNITY)

## 2023-10-14 ENCOUNTER — Other Ambulatory Visit: Payer: Self-pay | Admitting: Family Medicine

## 2023-11-15 ENCOUNTER — Telehealth: Payer: Self-pay | Admitting: Family Medicine

## 2023-11-15 MED ORDER — LANTUS SOLOSTAR 100 UNIT/ML ~~LOC~~ SOPN: 55.0000 [IU] | PEN_INJECTOR | Freq: Every day | SUBCUTANEOUS | 0 refills | Status: DC

## 2023-11-15 NOTE — Telephone Encounter (Signed)
 Copied from CRM [id number]. Topic: Clinical - Medication Question >> Nov 15, 2023 10:45 AM Cleave MATSU wrote: Reason for CRM: pt is out of insulin  and she was told she can't get anymore until mid September they want to know if Dr. Bluford can send in something else

## 2023-11-15 NOTE — Telephone Encounter (Signed)
 Spoke with patient.  She is using 55 units of lantus  but her script says 35.  Updated script sent to pharmacy and follow up appointment scheduled.

## 2023-11-25 ENCOUNTER — Encounter (HOSPITAL_COMMUNITY): Payer: Self-pay

## 2023-11-26 ENCOUNTER — Telehealth (HOSPITAL_COMMUNITY): Payer: Self-pay | Admitting: *Deleted

## 2023-11-26 NOTE — Telephone Encounter (Signed)
 Reaching out to patient to offer assistance regarding upcoming cardiac imaging study; pt verbalizes understanding of appt date/time, parking situation and where to check in, pre-test NPO status and medications ordered, and verified current allergies; name and call back number provided for further questions should they arise Debra Frame RN Navigator Cardiac Imaging [facility] [phone] office [phone] cell  Patient aware to avoid caffeine 12 hours prior to test.

## 2023-11-27 ENCOUNTER — Ambulatory Visit (HOSPITAL_COMMUNITY)
Admission: RE | Admit: 2023-11-27 | Discharge: 2023-11-27 | Disposition: A | Discharge status: Home / Self Care | Source: Ambulatory Visit | Attending: Internal Medicine | Admitting: Internal Medicine

## 2023-11-27 DIAGNOSIS — R079 Chest pain, unspecified: Secondary | ICD-10-CM | POA: Diagnosis present

## 2023-11-27 LAB — NM PET CT CARDIAC PERFUSION MULTI W/ABSOLUTE BLOODFLOW
LV dias vol: 132 mL (ref 46–106)
LV sys vol: 49 mL (ref 3.8–5.2)
MBFR: 1.92
Nuc Rest EF: 63 %
Nuc Stress EF: 61 %
Peak HR: 117 {beats}/min
Rest HR: 96 {beats}/min
Rest MBF: 1.52 ml/g/min
Rest Nuclear Isotope Dose: 30 mCi
ST Depression (mm): 0 mm
Stress MBF: 2.92 ml/g/min
Stress Nuclear Isotope Dose: 30 mCi

## 2023-11-27 MED ORDER — RUBIDIUM RB82 GENERATOR (RUBYFILL)
29.9600 | PACK | Freq: Once | INTRAVENOUS | Status: AC
  Administered 2023-11-27: 29.96 via INTRAVENOUS

## 2023-11-27 MED ORDER — REGADENOSON 0.4 MG/5ML IV SOLN
INTRAVENOUS | Status: AC
  Filled 2023-11-27: qty 5

## 2023-11-27 MED ORDER — REGADENOSON 0.4 MG/5ML IV SOLN
0.4000 mg | Freq: Once | INTRAVENOUS | Status: AC
  Administered 2023-11-27: 0.4 mg via INTRAVENOUS

## 2023-11-27 MED ORDER — RUBIDIUM RB82 GENERATOR (RUBYFILL)
29.9600 | PACK | Freq: Once | INTRAVENOUS | Status: AC
Start: 2023-11-27 — End: 2023-11-27
  Administered 2023-11-27: 29.96 via INTRAVENOUS

## 2023-12-02 ENCOUNTER — Ambulatory Visit

## 2023-12-02 ENCOUNTER — Other Ambulatory Visit: Payer: Self-pay | Admitting: Family Medicine

## 2023-12-02 ENCOUNTER — Ambulatory Visit
Admission: RE | Admit: 2023-12-02 | Discharge: 2023-12-02 | Disposition: A | Discharge status: Home / Self Care | Source: Ambulatory Visit | Attending: Family Medicine | Admitting: Family Medicine

## 2023-12-02 DIAGNOSIS — R928 Other abnormal and inconclusive findings on diagnostic imaging of breast: Secondary | ICD-10-CM

## 2023-12-02 DIAGNOSIS — N6325 Unspecified lump in the left breast, overlapping quadrants: Secondary | ICD-10-CM

## 2023-12-02 DIAGNOSIS — N6310 Unspecified lump in the right breast, unspecified quadrant: Secondary | ICD-10-CM

## 2023-12-02 DIAGNOSIS — N6315 Unspecified lump in the right breast, overlapping quadrants: Secondary | ICD-10-CM

## 2023-12-02 DIAGNOSIS — N6320 Unspecified lump in the left breast, unspecified quadrant: Secondary | ICD-10-CM

## 2023-12-04 ENCOUNTER — Ambulatory Visit: Admitting: Family Medicine

## 2023-12-09 ENCOUNTER — Ambulatory Visit: Payer: Self-pay | Admitting: Internal Medicine

## 2023-12-16 ENCOUNTER — Ambulatory Visit: Admitting: Family Medicine

## 2023-12-26 ENCOUNTER — Ambulatory Visit: Payer: Self-pay | Admitting: Family Medicine

## 2023-12-26 ENCOUNTER — Encounter: Payer: Self-pay | Admitting: Family Medicine

## 2023-12-26 ENCOUNTER — Ambulatory Visit: Payer: Self-pay

## 2023-12-26 ENCOUNTER — Ambulatory Visit (INDEPENDENT_AMBULATORY_CARE_PROVIDER_SITE_OTHER): Admitting: Family Medicine

## 2023-12-26 VITALS — BP 124/80 | HR 75 | Temp 98.3°F | Ht 71.0 in | Wt 301.0 lb

## 2023-12-26 DIAGNOSIS — R053 Chronic cough: Secondary | ICD-10-CM | POA: Diagnosis not present

## 2023-12-26 DIAGNOSIS — J029 Acute pharyngitis, unspecified: Secondary | ICD-10-CM

## 2023-12-26 DIAGNOSIS — J0110 Acute frontal sinusitis, unspecified: Secondary | ICD-10-CM

## 2023-12-26 LAB — RAPID STREP SCREEN (MED CTR MEBANE ONLY): Strep Gp A Ag, IA W/Reflex: NEGATIVE

## 2023-12-26 LAB — VERITOR FLU A/B WAIVED
Influenza A: NEGATIVE
Influenza B: NEGATIVE

## 2023-12-26 LAB — CULTURE, GROUP A STREP

## 2023-12-26 MED ORDER — PSEUDOEPHEDRINE-GUAIFENESIN ER 120-1200 MG PO TB12: 1.0000 | ORAL_TABLET | Freq: Two times a day (BID) | ORAL | 1 refills | Status: DC

## 2023-12-26 MED ORDER — AMOXICILLIN-POT CLAVULANATE 875-125 MG PO TABS: 1.0000 | ORAL_TABLET | Freq: Two times a day (BID) | ORAL | 0 refills | Status: DC

## 2023-12-26 NOTE — Progress Notes (Signed)
 Subjective:  Patient ID: Debra Mullins, female    DOB: 03/28/1980  Age: 43 y.o. MRN: [id number]  CC: cold/flu like symptoms (Headache, cough, congestion, sore throat, no fever, ears hurt)   HPI  Discussed the use of AI scribe software for clinical note transcription with the patient, who gave verbal consent to proceed.  History of Present Illness Debra Mullins is a 43 year old female who presents with severe headache, sore throat, and cough.  She has been experiencing a severe headache described as a 'really bad headache' with pressure behind her eyes and throughout her forehead, feeling as if her head might 'pop'. The headache worsens with coughing, which she tries to avoid due to increased pain.  She also reports ear pain and chest discomfort associated with the cough. Her symptoms began after returning from a trip to [place]  and [place] , where there was a significant temperature change. The symptoms started mildly on Monday and worsened significantly by Tuesday, with the headache and other symptoms becoming severe overnight.  She denies nasal congestion or runny nose prior to the nasal swab test, but mentions post-nasal drip contributing to her sore throat. She experiences chills and notes that her baseline temperature is typically low, around 96-66F, making a temperature of 98.23F feel like a low-grade fever for her. She experiences alternating hot and cold sensations, particularly when driving.  She reports shortness of breath, especially when moving around or lying down, which is exacerbated by coughing. No productive cough.  Her past medical history includes issues with liver function tests, which sometimes return elevated results. She mentions a history of being labeled as morbidly obese by previous doctors, which has impacted her interactions with healthcare providers. She works at [employer], primarily in a front-facing role, and has recently started this job.          12/26/2023    10:53 AM 06/10/2023    3:19 PM 05/14/2023    9:39 AM  Depression screen PHQ 2/9  Decreased Interest 0 0 0  Down, Depressed, Hopeless 0 0 0  PHQ - 2 Score 0 0 0  Altered sleeping  0 0  Tired, decreased energy  0 0  Change in appetite  0 0  Feeling bad or failure about yourself   0 0  Trouble concentrating  0 0  Moving slowly or fidgety/restless  0 0  Suicidal thoughts  0 0  PHQ-9 Score  0 0  Difficult doing work/chores  Not difficult at all Not difficult at all    History Debra Mullins has a past medical history of Diabetes (HCC), Heart attack (HCC) (2015), and Prinzmetal angina.   She has a past surgical history that includes Hysterotomy and Oophorectomy.   Her family history includes Breast cancer in her maternal aunt; CAD in her mother; Heart attack in her paternal aunt.She reports that she has never smoked. She has never used smokeless tobacco. She reports that she does not drink alcohol and does not use drugs.    ROS Review of Systems  Objective:  BP 124/80   Pulse 75   Temp 98.3 F (36.8 C)   Ht 5' 11 (1.803 m)   Wt (!) 301 lb (136.5 kg)   SpO2 97%   BMI 41.98 kg/m   BP Readings from Last 3 Encounters:  12/26/23 124/80  11/27/23 123/66  06/10/23 (!) 140/80    Wt Readings from Last 3 Encounters:  12/26/23 (!) 301 lb (136.5 kg)  06/10/23 (!) 303 lb (137.4 kg)  05/14/23 (!) 305 lb (138.3 kg)     Physical Exam Constitutional:      Appearance: She is well-developed.  HENT:     Head: Normocephalic and atraumatic.     Comments: Frontal sinuses tender to percussion     Right Ear: Tympanic membrane and external ear normal. No decreased hearing noted.     Left Ear: Tympanic membrane and external ear normal. No decreased hearing noted.     Nose: Mucosal edema and congestion present.     Right Sinus: Frontal sinus tenderness present.     Left Sinus: Frontal sinus tenderness present.     Mouth/Throat:     Pharynx: No oropharyngeal exudate or posterior oropharyngeal  erythema.  Neck:     Meningeal: Brudzinski's sign absent.  Pulmonary:     Effort: No respiratory distress.     Breath sounds: Normal breath sounds.  Lymphadenopathy:     Head:     Right side of head: No preauricular adenopathy.     Left side of head: No preauricular adenopathy.     Cervical:     Right cervical: No superficial cervical adenopathy.    Left cervical: No superficial cervical adenopathy.  Skin:    Findings: No rash.    Physical Exam    Assessment & Plan:  Acute non-recurrent frontal sinusitis -     Amoxicillin -Pot Clavulanate; Take 1 tablet by mouth 2 (two) times daily. Take all of this medication  Dispense: 20 tablet; Refill: 0 -     Pseudoephedrine-guaiFENesin ER; Take 1 tablet by mouth 2 (two) times daily. For congestion  Dispense: 14 tablet; Refill: 1  Sore throat -     Rapid Strep Screen ([facility] ONLY); Future -     COVID-19, Flu A+B and RSV  Chronic cough -     Veritor Flu A/B Waived    Assessment and Plan Assessment & Plan Acute sinusitis   She presents with acute sinusitis characterized by severe headache, ear pain, sore throat, chest pain, cough, and post-nasal drip after visiting a colder climate. Examination reveals nasal passage swelling and sinus tenderness, with negative results for strep and other infections. Prescribe Augmentin  875 mg twice daily with food for 10 days. Recommend a decongestant for headache and drainage. Advise Tylenol for pain management, ensuring no liver disease contraindications.  Obesity   Obesity is contributing to potential health issues, including fatty liver. Emphasize the importance of weight loss for managing fatty liver and overall health. She expresses frustration with previous medical interactions regarding her weight. Discuss weight management strategies with Dr. Bluford and consider referral to a medical weight loss center in [place].       Follow-up: Return if symptoms worsen or fail to  improve.  Butler Der, M.D.

## 2023-12-26 NOTE — Telephone Encounter (Signed)
 Patient was seen today at [facility] for concern

## 2023-12-26 NOTE — Telephone Encounter (Signed)
 FYI Only or Action Required?: FYI only for provider.  Patient was last seen in primary care on 06/10/2023 by Cook, Jayce G, DO.  Called Nurse Triage reporting Chest Pain.  Symptoms began today.  Interventions attempted: Nothing.  Symptoms are: unchanged.  Triage Disposition: See HCP Within 4 Hours (Or PCP Triage)  Patient/caregiver understands and will follow disposition?:     Copied from CRM [id number]. Topic: Clinical - Red Word Triage >> Dec 26, 2023  8:15 AM Emylou G wrote: [facility] that prompted transfer to Nurse Triage: hot and cold chills, sore throat, headache, coughing, chest pain Reason for Disposition  [1] Chest pain lasts > 5 minutes AND [2] occurred > 3 days ago (72 hours) AND [3] NO chest pain or cardiac symptoms now  [1] MILD difficulty breathing (e.g., minimal/no SOB at rest, SOB with walking, pulse < 100) AND [2] still present when not coughing  Answer Assessment - Initial Assessment Questions 1. LOCATION: Where does it hurt?       Left chest under left breasts - pt states chest soreness 2. RADIATION: Does the pain go anywhere else? (e.g., into neck, jaw, arms, back)     no 3. ONSET: When did the chest pain begin? (Minutes, hours or days)      Started 20 minutes 4. PATTERN: Does the pain come and go, or has it been constant since it started?  Does it get worse with exertion?      constant 5. DURATION: How long does it last (e.g., seconds, minutes, hours)     na 6. SEVERITY: How bad is the pain?  (e.g., Scale 1-10; mild, moderate, or severe)   3/10 7. CARDIAC RISK FACTORS: Do you have any history of heart problems or risk factors for heart disease? (e.g., angina, prior heart attack; diabetes, high blood pressure, high cholesterol, smoker, or strong family history of heart disease)     Heart attack  8. PULMONARY RISK FACTORS: Do you have any history of lung disease?  (e.g., blood clots in lung, asthma, emphysema, birth control pills)     no 9.  CAUSE: What do you think is causing the chest pain?      Pt states due to cold 10. OTHER SYMPTOMS: Do you have any other symptoms? (e.g., dizziness, nausea, vomiting, sweating, fever, difficulty breathing, cough)       SOB - last couple of day, nausea, lightheaded since 0300 am , headache 8/10 11. PREGNANCY: Is there any chance you are pregnant? When was your last menstrual period?       na  Answer Assessment - Initial Assessment Questions 1. ONSET: When did the cough begin?      X 3 days 2. SEVERITY: How bad is the cough today?      moderate 3. SPUTUM: Describe the color of your sputum (e.g., none, dry cough; clear, white, yellow, green)     no 4. HEMOPTYSIS: Are you coughing up any blood? If Yes, ask: How much? (e.g., flecks, streaks, tablespoons, etc.)     no 5. DIFFICULTY BREATHING: Are you having difficulty breathing? If Yes, ask: How bad is it? (e.g., mild, moderate, severe)      SOB-moderate 6. FEVER: Do you have a fever? If Yes, ask: What is your temperature, how was it measured, and when did it start?     unknown 7. CARDIAC HISTORY: Do you have any history of heart disease? (e.g., heart attack, congestive heart failure)      Heart attack 8. LUNG HISTORY:  Do you have any history of lung disease?  (e.g., pulmonary embolus, asthma, emphysema)     no 9. PE RISK FACTORS: Do you have a history of blood clots? (or: recent major surgery, recent prolonged travel, bedridden)     na 10. OTHER SYMPTOMS: Do you have any other symptoms? (e.g., runny nose, wheezing, chest pain)       Chest 3/10, sore throat, 3/10 chest pain, hot & cold chills 11. PREGNANCY: Is there any chance you are pregnant? When was your last menstrual period?       na 12. TRAVEL: Have you traveled out of the country in the last month? (e.g., travel history, exposures)       [place] , [place]  Husband pulled off tick that was on back the tick /area puffy. Area is red and raised like a  pimple today.  And tick was size sunflower seed without shell.  Protocols used: Chest Pain-A-AH, Cough - Acute Non-Productive-A-AH

## 2023-12-27 LAB — COVID-19, FLU A+B AND RSV
Influenza A, NAA: NOT DETECTED
Influenza B, NAA: NOT DETECTED
RSV, NAA: NOT DETECTED
SARS-CoV-2, NAA: NOT DETECTED

## 2024-01-02 ENCOUNTER — Other Ambulatory Visit: Payer: Self-pay | Admitting: Family Medicine

## 2024-01-17 ENCOUNTER — Encounter: Payer: Self-pay | Admitting: Family

## 2024-01-17 ENCOUNTER — Ambulatory Visit (INDEPENDENT_AMBULATORY_CARE_PROVIDER_SITE_OTHER): Admitting: Family

## 2024-01-17 ENCOUNTER — Ambulatory Visit: Payer: Self-pay | Admitting: Family

## 2024-01-17 ENCOUNTER — Ambulatory Visit

## 2024-01-17 ENCOUNTER — Ambulatory Visit: Payer: Self-pay

## 2024-01-17 VITALS — BP 125/84 | HR 82 | Temp 97.7°F | Ht 71.0 in | Wt 300.2 lb

## 2024-01-17 DIAGNOSIS — J069 Acute upper respiratory infection, unspecified: Secondary | ICD-10-CM

## 2024-01-17 DIAGNOSIS — R0602 Shortness of breath: Secondary | ICD-10-CM

## 2024-01-17 DIAGNOSIS — R059 Cough, unspecified: Secondary | ICD-10-CM

## 2024-01-17 DIAGNOSIS — I201 Angina pectoris with documented spasm: Secondary | ICD-10-CM

## 2024-01-17 DIAGNOSIS — R002 Palpitations: Secondary | ICD-10-CM

## 2024-01-17 LAB — VERITOR SARS-COV-2 AND FLU A+B
BD Veritor SARS-CoV-2 Ag: NEGATIVE
Influenza A: NEGATIVE
Influenza B: NEGATIVE

## 2024-01-17 MED ORDER — FLUTICASONE PROPIONATE 50 MCG/ACT NA SUSP: 2.0000 | Freq: Every day | NASAL | 6 refills | Status: AC

## 2024-01-17 MED ORDER — CETIRIZINE HCL 10 MG PO TABS: 10.0000 mg | ORAL_TABLET | Freq: Every day | ORAL | 11 refills | Status: AC

## 2024-01-17 MED ORDER — ALBUTEROL SULFATE HFA 108 (90 BASE) MCG/ACT IN AERS: 2.0000 | INHALATION_SPRAY | Freq: Four times a day (QID) | RESPIRATORY_TRACT | 0 refills | Status: AC | PRN

## 2024-01-17 NOTE — Patient Instructions (Signed)

## 2024-01-17 NOTE — Telephone Encounter (Signed)
 FYI Only or Action Required?: FYI only for provider: appointment scheduled on 01/17/2024 at 1:55 PM.  Patient was last seen in primary care on 12/26/2023 by Zollie Lowers, MD.  Called Nurse Triage reporting Cough.  Symptoms began Wednesday.  Interventions attempted: Rest, hydration, or home remedies.  Symptoms are: unchanged.  Triage Disposition: See HCP Within 4 Hours (Or PCP Triage)  Patient/caregiver understands and will follow disposition?: Yes   Message from Victoria B sent at 01/17/2024 10:31 AM EST  Reason for Triage: Patient has heart palpatations   Reason for Disposition  [1] MILD difficulty breathing (e.g., minimal/no SOB at rest, SOB with walking, pulse < 100) AND [2] still present when not coughing  Answer Assessment - Initial Assessment Questions 1. ONSET: When did the cough begin?      Started on Wednesday 2. SEVERITY: How bad is the cough today?      mild 3. SPUTUM: Describe the color of your sputum (e.g., none, dry cough; clear, white, yellow, green)     dry 4. HEMOPTYSIS: Are you coughing up any blood? If Yes, ask: How much? (e.g., flecks, streaks, tablespoons, etc.)     no 5. DIFFICULTY BREATHING: Are you having difficulty breathing? If Yes, ask: How bad is it? (e.g., mild, moderate, severe)      mild 6. FEVER: Do you have a fever? If Yes, ask: What is your temperature, how was it measured, and when did it start?     no 7. CARDIAC HISTORY: Do you have any history of heart disease? (e.g., heart attack, congestive heart failure)      yes 8. LUNG HISTORY: Do you have any history of lung disease?  (e.g., pulmonary embolus, asthma, emphysema)     no 9. PE RISK FACTORS: Do you have a history of blood clots? (or: recent major surgery, recent prolonged travel, bedridden)     no 10. OTHER SYMPTOMS: Do you have any other symptoms? (e.g., runny nose, wheezing, chest pain)       Headache, heart palpitations 11. PREGNANCY: Is there any  chance you are pregnant? When was your last menstrual period?       no 12. TRAVEL: Have you traveled out of the country in the last month? (e.g., travel history, exposures)       no  Protocols used: Cough - Acute Non-Productive-A-AH

## 2024-01-17 NOTE — Progress Notes (Signed)
 Subjective:    Patient ID: Debra Mullins, female    DOB: Dec 04, 1980, 43 y.o.   MRN: [id number]  Chief Complaint  Patient presents with   Cough    Sob causing pain when coughing, chills  Wednesday    PT presents to the office with chest fluttering that started Wednesday.  Then she started having a cough.   Reports feeling fatigued and SOB.   She has prinzmetal angina and followed by Cardiologists. Wants a new referral to [facility].  Cough This is a new problem. The current episode started in the past 7 days. The problem has been gradually worsening. The problem occurs every few minutes. The cough is Non-productive. Associated symptoms include chills, ear congestion, ear pain, a sore throat and shortness of breath. Pertinent negatives include no fever, headaches, myalgias, nasal congestion or wheezing. She has tried rest and OTC cough suppressant for the symptoms. The treatment provided mild relief.      Review of Systems  Constitutional:  Positive for chills. Negative for fever.  HENT:  Positive for ear pain and sore throat.   Respiratory:  Positive for cough and shortness of breath. Negative for wheezing.   Musculoskeletal:  Negative for myalgias.  Neurological:  Negative for headaches.  All other systems reviewed and are negative.   Social History   Socioeconomic History   Marital status: Married    Spouse name: Not on file   Number of children: Not on file   Years of education: Not on file   Highest education level: Not on file  Occupational History   Not on file  Tobacco Use   Smoking status: Never   Smokeless tobacco: Never  Vaping Use   Vaping status: Never Used  Substance and Sexual Activity   Alcohol use: Never   Drug use: Never   Sexual activity: Not on file  Other Topics Concern   Not on file  Social History Narrative   Are you right handed or left handed? Right handed    Are you currently employed ? 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 No with family    What type of home do you live in: 1 story or 2 story?  1 story        Social Drivers of Corporate Investment Banker Strain: Not on file  Food Insecurity: Not on file  Transportation Needs: Not on file  Physical Activity: Not on file  Stress: Not on file  Social Connections: Unknown (07/25/2021)   Received from [facility]   Social Network    Social Network: Not on file   Family History  Problem Relation Age of Onset   CAD Mother    Breast cancer Maternal Aunt        late 20s   Heart attack Paternal Aunt         Objective:   Physical Exam Vitals reviewed.  Constitutional:      General: She is not in acute distress.    Appearance: She is well-developed.  HENT:     Head: Normocephalic and atraumatic.     Right Ear: Tympanic membrane normal.     Left Ear: Tympanic membrane normal.  Eyes:     Pupils: Pupils are equal, round, and reactive to light.  Neck:     Thyroid : No thyromegaly.  Cardiovascular:     Rate and Rhythm: Normal rate and regular rhythm.  Heart sounds: Normal heart sounds. No murmur heard. Pulmonary:     Effort: Pulmonary effort is normal. No respiratory distress.     Breath sounds: Normal breath sounds. No wheezing.     Comments: Dry nonproductive cough Abdominal:     General: Bowel sounds are normal. There is no distension.     Palpations: Abdomen is soft.     Tenderness: There is no abdominal tenderness.  Musculoskeletal:        General: No tenderness. Normal range of motion.     Cervical back: Normal range of motion and neck supple.  Skin:    General: Skin is warm and dry.  Neurological:     Mental Status: She is alert and oriented to person, place, and time.     Cranial Nerves: No cranial nerve deficit.     Deep Tendon Reflexes: Reflexes are normal and symmetric.  Psychiatric:        Behavior: Behavior normal.        Thought Content: Thought content normal.        Judgment:  Judgment normal.       BP 125/84   Pulse 82   Temp 97.7 F (36.5 C) (Temporal)   Ht 5' 11 (1.803 m)   Wt (!) 300 lb 3.2 oz (136.2 kg)   SpO2 97%   BMI 41.87 kg/m      Assessment & Plan:  Debra Mullins comes in today with chief complaint of Cough (Sob causing pain when coughing, chills  Wednesday )   Diagnosis and orders addressed:  1. Palpitations (Primary) - Anemia Profile B - CMP14+EGFR - TSH - Ambulatory referral to Cardiology  2. Cough, unspecified type - Veritor SARS-CoV-2 and Flu A+B - DG Chest 2 View; Future - CMP14+EGFR  3. SOB (shortness of breath) - EKG 12-Lead - DG Chest 2 View; Future - CMP14+EGFR - Ambulatory referral to Cardiology  4. Prinzmetal angina - CMP14+EGFR - Ambulatory referral to Cardiology  5. Viral URI - Take meds as prescribed - Use a cool mist humidifier  -Use saline nose sprays frequently -Force fluids -For any cough or congestion  Use plain Mucinex- regular strength or max strength is fine -For fever or aces or pains- take tylenol or ibuprofen. -Throat lozenges if help -Follow up if symptoms worsen or do not improve  - albuterol  (VENTOLIN  HFA) 108 (90 Base) MCG/ACT inhaler; Inhale 2 puffs into the lungs every 6 (six) hours as needed for wheezing or shortness of breath.  Dispense: 8 g; Refill: 0 - cetirizine (ZYRTEC) 10 MG tablet; Take 1 tablet (10 mg total) by mouth daily.  Dispense: 30 tablet; Refill: 11 - fluticasone (FLONASE) 50 MCG/ACT nasal spray; Place 2 sprays into both nostrils daily.  Dispense: 16 g; Refill: 6 - CMP14+EGFR   Labs pending to rule out causes for fluttering COVID and Flu negative in office  Start zyrtec and flonase Albuterol  as needed Continue current medications  Referral placed to Cardiologists in [facility] per her request EKG stable  Chest x-ray pending  Follow up if symptoms worsen or do not improve    Bari Learn, FNP

## 2024-01-17 NOTE — Telephone Encounter (Signed)
 Noted

## 2024-01-18 LAB — CMP14+EGFR
ALT: 28 IU/L (ref 0–32)
AST: 17 IU/L (ref 0–40)
Albumin: 4.4 g/dL (ref 3.9–4.9)
Alkaline Phosphatase: 94 IU/L (ref 41–116)
BUN/Creatinine Ratio: 19 (ref 9–23)
BUN: 14 mg/dL (ref 6–24)
Bilirubin Total: 0.2 mg/dL (ref 0.0–1.2)
CO2: 24 mmol/L (ref 20–29)
Calcium: 9.9 mg/dL (ref 8.7–10.2)
Chloride: 101 mmol/L (ref 96–106)
Creatinine, Ser: 0.72 mg/dL (ref 0.57–1.00)
Globulin, Total: 2.7 g/dL (ref 1.5–4.5)
Glucose: 227 mg/dL — ABNORMAL HIGH (ref 70–99)
Potassium: 4.4 mmol/L (ref 3.5–5.2)
Sodium: 139 mmol/L (ref 134–144)
Total Protein: 7.1 g/dL (ref 6.0–8.5)
eGFR: 105 mL/min/1.73 (ref >59)

## 2024-01-18 LAB — ANEMIA PROFILE B
Basophils Absolute: 0 x10E3/uL (ref 0.0–0.2)
Basos: 1 %
EOS (ABSOLUTE): 0.2 x10E3/uL (ref 0.0–0.4)
Eos: 2 %
Ferritin: 257 ng/mL — ABNORMAL HIGH (ref 15–150)
Folate: 11.6 ng/mL (ref >3.0)
Hematocrit: 40.5 % (ref 34.0–46.6)
Hemoglobin: 13.3 g/dL (ref 11.1–15.9)
Immature Grans (Abs): 0.1 x10E3/uL (ref 0.0–0.1)
Immature Granulocytes: 1 %
Iron Saturation: 31 % (ref 15–55)
Iron: 88 ug/dL (ref 27–159)
Lymphocytes Absolute: 2.5 x10E3/uL (ref 0.7–3.1)
Lymphs: 29 %
MCH: 29.8 pg (ref 26.6–33.0)
MCHC: 32.8 g/dL (ref 31.5–35.7)
MCV: 91 fL (ref 79–97)
Monocytes Absolute: 0.7 x10E3/uL (ref 0.1–0.9)
Monocytes: 8 %
Neutrophils Absolute: 5.2 x10E3/uL (ref 1.4–7.0)
Neutrophils: 59 %
Platelets: 302 x10E3/uL (ref 150–450)
RBC: 4.46 x10E6/uL (ref 3.77–5.28)
RDW: 12.4 % (ref 11.7–15.4)
Retic Ct Pct: 1.6 % (ref 0.6–2.6)
Total Iron Binding Capacity: 286 ug/dL (ref 250–450)
UIBC: 198 ug/dL (ref 131–425)
Vitamin B-12: 398 pg/mL (ref 232–1245)
WBC: 8.7 x10E3/uL (ref 3.4–10.8)

## 2024-01-18 LAB — TSH: TSH: 1.65 u[IU]/mL (ref 0.450–4.500)

## 2024-01-23 ENCOUNTER — Ambulatory Visit: Admitting: Family Medicine

## 2024-01-23 VITALS — HR 87 | Temp 97.5°F | Ht 71.0 in | Wt 300.0 lb

## 2024-01-23 DIAGNOSIS — E1149 Type 2 diabetes mellitus with other diabetic neurological complication: Secondary | ICD-10-CM

## 2024-01-23 DIAGNOSIS — Z7985 Long-term (current) use of injectable non-insulin antidiabetic drugs: Secondary | ICD-10-CM

## 2024-01-23 MED ORDER — TIRZEPATIDE 2.5 MG/0.5ML ~~LOC~~ SOAJ: 2.5000 mg | SUBCUTANEOUS | 0 refills | Status: DC

## 2024-01-23 NOTE — Patient Instructions (Signed)
 A1c today.     Follow-up in 3 months.

## 2024-01-24 LAB — HEMOGLOBIN A1C
Est. average glucose Bld gHb Est-mCnc: 214 mg/dL
Hgb A1c MFr Bld: 9.1 % — ABNORMAL HIGH (ref 4.8–5.6)

## 2024-01-26 MED ORDER — TOUJEO SOLOSTAR 300 UNIT/ML ~~LOC~~ SOPN: 55.0000 [IU] | PEN_INJECTOR | Freq: Every day | SUBCUTANEOUS | 1 refills | Status: AC

## 2024-01-26 NOTE — Assessment & Plan Note (Signed)
Starting Mounjaro

## 2024-01-26 NOTE — Assessment & Plan Note (Signed)
 Uncontrolled.  A1c returned at 9.1.  Adding Mounjaro.

## 2024-01-26 NOTE — Progress Notes (Signed)
 Subjective:  Patient ID: Debra Mullins, female    DOB: 07/28/1980  Age: 43 y.o. MRN: [id number]  CC:   Chief Complaint  Patient presents with   Weight Loss    Patient is here for wanting to talk about weight loss recommendation/medications.  She mentioned having a cough and went to the doctor last week and only gave an inhaler, but is mainly here for weight loss medication.     HPI:  43 year old female with uncontrolled type 2 diabetes and morbid obesity presents for evaluation of the above.  Patient states that she is interested in GLP-1 medication to help with glycemic control and weight loss.  To the Mounjaro.  Needs an updated A1c.  Last A1c was 10.1 in March.  Additionally, patient is on insulin  therapy.  She states that it takes a long time to administer Lantus .  She would like to switch to another insulin .  Patient Active Problem List   Diagnosis Date Noted   Morbid obesity (HCC) 01/26/2024   DOE (dyspnea on exertion) 06/10/2023   Carpal tunnel syndrome 03/31/2023   Type 2 diabetes mellitus with neurological complications (HCC) 03/31/2023   Palpitations 02/27/2023   Diabetic peripheral neuropathy (HCC) 08/22/2022   Irritable bowel syndrome with constipation 08/22/2022   GERD (gastroesophageal reflux disease) 03/12/2022   Episode of shaking 03/12/2022   Mixed hyperlipidemia 02/23/2021   Prinzmetal angina     Social Hx   Social History   Socioeconomic History   Marital status: Married    Spouse name: Not on file   Number of children: Not on file   Years of education: Not on file   Highest education level: Not on file  Occupational History   Not on file  Tobacco Use   Smoking status: Never   Smokeless tobacco: Never  Vaping Use   Vaping status: Never Used  Substance and Sexual Activity   Alcohol use: Never   Drug use: Never   Sexual activity: Not on file  Other Topics Concern   Not on file  Social History Narrative   Are you right handed or left handed? Right  handed    Are you currently employed ? 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 No with family    What type of home do you live in: 1 story or 2 story?  1 story        Social Drivers of Corporate Investment Banker Strain: Not on file  Food Insecurity: Not on file  Transportation Needs: Not on file  Physical Activity: Not on file  Stress: Not on file  Social Connections: Unknown (07/25/2021)   Received from [employer]   Social Network    Social Network: Not on file    Review of Systems Per HPI  Objective:  Pulse 87   Temp (!) 97.5 F (36.4 C)   Ht 5' 11 (1.803 m)   Wt 300 lb (136.1 kg)   BMI 41.84 kg/m      01/23/2024   11:26 AM 01/17/2024    2:05 PM 12/26/2023   10:49 AM  BP/Weight  Systolic BP  125 875  Diastolic BP  84 80  Wt. (Lbs) 300 300.2 301  BMI 41.84 kg/m2 41.87 kg/m2 41.98 kg/m2    Physical Exam Vitals and nursing note reviewed.  Constitutional:      General: She is not in acute distress.    Appearance: Normal appearance. She  is obese.  HENT:     Head: Normocephalic and atraumatic.  Cardiovascular:     Rate and Rhythm: Normal rate and regular rhythm.  Pulmonary:     Effort: Pulmonary effort is normal.     Breath sounds: Normal breath sounds. No wheezing or rales.  Neurological:     Mental Status: She is alert.     Lab Results  Component Value Date   WBC 8.7 01/17/2024   HGB 13.3 01/17/2024   HCT 40.5 01/17/2024   PLT 302 01/17/2024   GLUCOSE 227 (H) 01/17/2024   CHOL 238 (H) 06/10/2023   TRIG 335 (H) 06/10/2023   HDL 37 (L) 06/10/2023   LDLCALC 140 (H) 06/10/2023   ALT 28 01/17/2024   AST 17 01/17/2024   NA 139 01/17/2024   K 4.4 01/17/2024   CL 101 01/17/2024   CREATININE 0.72 01/17/2024   BUN 14 01/17/2024   CO2 24 01/17/2024   TSH 1.650 01/17/2024   HGBA1C 9.1 (H) 01/23/2024     Assessment & Plan:  Type 2 diabetes mellitus with neurological complications (HCC) Assessment &  Plan: Uncontrolled.  A1c returned at 9.1.  Adding Mounjaro.  Orders: -     Hemoglobin A1c -     Tirzepatide; Inject 2.5 mg into the skin once a week.  Dispense: 2 mL; Refill: 0 -     Toujeo  SoloStar; Inject 55 Units into the skin daily.  Dispense: 9 mL; Refill: 1  Morbid obesity (HCC) Assessment & Plan: Starting Mounjaro.   Orders: -     Tirzepatide; Inject 2.5 mg into the skin once a week.  Dispense: 2 mL; Refill: 0    Follow-up:  3 months  Evangelia Whitaker Bluford DO [facility]

## 2024-01-27 ENCOUNTER — Other Ambulatory Visit: Payer: Self-pay | Admitting: Family Medicine

## 2024-01-31 ENCOUNTER — Telehealth: Payer: Self-pay

## 2024-01-31 NOTE — Telephone Encounter (Signed)
 Copied from CRM #[id number]. Topic: Referral - Status >> Jan 31, 2024 10:35 AM Delon HERO wrote: Reason for CRM: Patient's husband is calling to check on the status of cardiology referral in [place].

## 2024-02-05 ENCOUNTER — Other Ambulatory Visit: Payer: Self-pay

## 2024-02-11 NOTE — Telephone Encounter (Signed)
 I called patient before Thanksgiving and she stated she would give us  a call back to where she wanted to be referred to because she had her own preference regarding the referral.

## 2024-02-15 ENCOUNTER — Other Ambulatory Visit: Payer: Self-pay | Admitting: Family Medicine

## 2024-02-15 DIAGNOSIS — E1149 Type 2 diabetes mellitus with other diabetic neurological complication: Secondary | ICD-10-CM

## 2024-02-19 ENCOUNTER — Other Ambulatory Visit: Payer: Self-pay | Admitting: Family Medicine

## 2024-02-19 ENCOUNTER — Telehealth: Payer: Self-pay | Admitting: Family Medicine

## 2024-02-19 NOTE — Telephone Encounter (Signed)
 Refill     tirzepatide  (MOUNJARO ) 2.5 MG/0.5ML Pen   [facility]

## 2024-02-20 ENCOUNTER — Other Ambulatory Visit: Payer: Self-pay | Admitting: Family Medicine

## 2024-02-21 ENCOUNTER — Ambulatory Visit: Payer: Self-pay

## 2024-02-21 NOTE — Telephone Encounter (Signed)
 Pt reports starting Mounjaro , one week ago- first dose- and last night developed GI pain, with cramping, diarrhea and heartburn. Pt states she has a hx of endometriosis so she is used to abdominal discomfort and diarrhea but feels like it all flared with Mounjaro . Sates that she is already on protonix  daily for GERD but it is not helping with heartburn and symptoms are now causing nausea. Pt due for next dose of mounjaro  today but is holding rx

## 2024-02-21 NOTE — Telephone Encounter (Signed)
 FYI Only or Action Required?: Action required by provider: clinical question for provider and update on patient condition.  Patient was last seen in primary care on 01/23/2024 by Cook, Jayce G, DO.  Called Nurse Triage reporting Medication Problem.  Symptoms began yesterday.  Interventions attempted: Prescription medications: MOUNJARO  2.5 MG/0.5ML Pen.  Symptoms are: gradually worsening.  Triage Disposition: Call PCP When Office is Open  Patient/caregiver understands and will follow disposition?: No, wishes to speak with PCP   Message from Melrose R sent at 02/21/2024 12:12 PM EST  Reason for Triage: Reason for CRM: Questions about medication she is taking,wants to know if side effects are expected: severe stomach pain (discomfort) . Requesting callbackMobile [phone]     Reason for Disposition  [1] Caller has NON-URGENT medicine question about med that PCP prescribed AND [2] triager unable to answer question  Answer Assessment - Initial Assessment Questions 1. NAME of MEDICINE: What medicine(s) are you calling about?     Pt reports starting Mounjaro , yesterday and last night developed GI pain, with cramping, diarrhea and heartburn. Pt states she has a hx of endometriosis so she is used to abdominal discomfort and diarrhea but feels like it all flared with Mounjaro . Sates that she is already on protonix  daily for GERD but it is not helping with heartburn and symptoms are now causing nausea. Pt due for next dose of mounjaro  today but is holding rx until she hears from PCP d/t side effects. Please advise.  Protocols used: Medication Question Call-A-AH

## 2024-02-21 NOTE — Telephone Encounter (Signed)
 Patient advised to stop Mounjaro  due to abdominal pain and wants to know if there is something else she can tak in its place that will help with sugars and weight

## 2024-02-21 NOTE — Telephone Encounter (Signed)
 Nurse to call Patient on Mounjaro  Apparently having some side effects Please find out what type of side effects she have When did she last take her Mounjaro  dose To hold off on taking additional Mounjaro  May need to have some additional labs Please obtain additional information

## 2024-02-24 NOTE — Telephone Encounter (Signed)
 Cook, Jayce G, DO     02/23/24 11:41 AM We can try Ozempic  (however, side effect profiles are the same)

## 2024-03-02 NOTE — Telephone Encounter (Signed)
 Called and left a message for pt regarding medication change recommendation , please see/ relay provider message  We can try Ozempic  (however, side effect profiles are the same)

## 2024-03-11 NOTE — Telephone Encounter (Signed)
 Called patient and left voicemail regarding med switch

## 2024-03-16 NOTE — Telephone Encounter (Signed)
 Left vm regarding letter being sent in the mail  Tried to contact 3 times

## 2024-03-17 NOTE — Telephone Encounter (Signed)
 Called pt 3 times with no response, sent letter in the mail

## 2024-03-17 NOTE — Telephone Encounter (Signed)
 Sent pt letter in the mail

## 2024-05-22 ENCOUNTER — Ambulatory Visit: Admitting: Family Medicine
